# Patient Record
Sex: Male | Born: 1970 | Race: Black or African American | Hispanic: No | Marital: Single | Smoking: Never smoker
Health system: Southern US, Community
[De-identification: ages and names within clinical notes are randomized; demographics above are authoritative.]

## PROBLEM LIST (undated history)

## (undated) DIAGNOSIS — E079 Disorder of thyroid, unspecified: Secondary | ICD-10-CM

## (undated) DIAGNOSIS — E119 Type 2 diabetes mellitus without complications: Secondary | ICD-10-CM

## (undated) HISTORY — PX: NM THYROID STIM SUPRESS: HXRAD602

## (undated) HISTORY — PX: HEMATOMA EVACUATION: SHX5118

## (undated) HISTORY — DX: Disorder of thyroid, unspecified: E07.9

## (undated) HISTORY — DX: Type 2 diabetes mellitus without complications: E11.9

---

## 1998-02-16 ENCOUNTER — Emergency Department (HOSPITAL_COMMUNITY): Admission: EM | Admit: 1998-02-16 | Discharge: 1998-02-16 | Payer: Self-pay | Admitting: Emergency Medicine

## 1998-04-29 ENCOUNTER — Emergency Department (HOSPITAL_COMMUNITY): Admission: EM | Admit: 1998-04-29 | Discharge: 1998-04-29 | Payer: Self-pay | Admitting: Emergency Medicine

## 1998-06-01 ENCOUNTER — Emergency Department (HOSPITAL_COMMUNITY): Admission: EM | Admit: 1998-06-01 | Discharge: 1998-06-01 | Payer: Self-pay | Admitting: Emergency Medicine

## 1998-09-18 ENCOUNTER — Emergency Department (HOSPITAL_COMMUNITY): Admission: EM | Admit: 1998-09-18 | Discharge: 1998-09-18 | Payer: Self-pay | Admitting: Emergency Medicine

## 1998-09-21 ENCOUNTER — Encounter: Admission: RE | Admit: 1998-09-21 | Discharge: 1998-09-21 | Payer: Self-pay | Admitting: Sports Medicine

## 1998-11-27 ENCOUNTER — Emergency Department (HOSPITAL_COMMUNITY): Admission: EM | Admit: 1998-11-27 | Discharge: 1998-11-27 | Payer: Self-pay | Admitting: Internal Medicine

## 1999-06-01 ENCOUNTER — Observation Stay (HOSPITAL_COMMUNITY): Admission: EM | Admit: 1999-06-01 | Discharge: 1999-06-02 | Payer: Self-pay | Admitting: Emergency Medicine

## 1999-07-30 ENCOUNTER — Inpatient Hospital Stay (HOSPITAL_COMMUNITY): Admission: EM | Admit: 1999-07-30 | Discharge: 1999-08-03 | Payer: Self-pay | Admitting: Emergency Medicine

## 1999-07-30 ENCOUNTER — Encounter: Payer: Self-pay | Admitting: Family Medicine

## 1999-07-31 ENCOUNTER — Encounter: Payer: Self-pay | Admitting: *Deleted

## 2000-03-24 ENCOUNTER — Emergency Department (HOSPITAL_COMMUNITY): Admission: EM | Admit: 2000-03-24 | Discharge: 2000-03-24 | Payer: Self-pay | Admitting: Emergency Medicine

## 2000-03-27 ENCOUNTER — Emergency Department (HOSPITAL_COMMUNITY): Admission: EM | Admit: 2000-03-27 | Discharge: 2000-03-27 | Payer: Self-pay | Admitting: Emergency Medicine

## 2000-04-01 ENCOUNTER — Encounter: Payer: Self-pay | Admitting: General Surgery

## 2000-04-01 ENCOUNTER — Encounter: Admission: RE | Admit: 2000-04-01 | Discharge: 2000-04-01 | Payer: Self-pay | Admitting: Family Medicine

## 2000-04-01 ENCOUNTER — Ambulatory Visit (HOSPITAL_COMMUNITY): Admission: RE | Admit: 2000-04-01 | Discharge: 2000-04-01 | Payer: Self-pay | Admitting: Sports Medicine

## 2000-04-01 ENCOUNTER — Ambulatory Visit (HOSPITAL_COMMUNITY): Admission: AD | Admit: 2000-04-01 | Discharge: 2000-04-01 | Payer: Self-pay | Admitting: General Surgery

## 2000-04-04 ENCOUNTER — Emergency Department (HOSPITAL_COMMUNITY): Admission: EM | Admit: 2000-04-04 | Discharge: 2000-04-04 | Payer: Self-pay | Admitting: Emergency Medicine

## 2000-11-07 ENCOUNTER — Emergency Department (HOSPITAL_COMMUNITY): Admission: EM | Admit: 2000-11-07 | Discharge: 2000-11-07 | Payer: Self-pay | Admitting: Emergency Medicine

## 2001-04-21 ENCOUNTER — Emergency Department (HOSPITAL_COMMUNITY): Admission: EM | Admit: 2001-04-21 | Discharge: 2001-04-21 | Payer: Self-pay | Admitting: Internal Medicine

## 2001-05-22 ENCOUNTER — Emergency Department (HOSPITAL_COMMUNITY): Admission: EM | Admit: 2001-05-22 | Discharge: 2001-05-22 | Payer: Self-pay | Admitting: Emergency Medicine

## 2001-05-23 ENCOUNTER — Inpatient Hospital Stay (HOSPITAL_COMMUNITY): Admission: EM | Admit: 2001-05-23 | Discharge: 2001-05-28 | Payer: Self-pay

## 2001-05-24 ENCOUNTER — Encounter: Payer: Self-pay | Admitting: Family Medicine

## 2001-05-28 ENCOUNTER — Encounter: Payer: Self-pay | Admitting: Family Medicine

## 2001-07-03 ENCOUNTER — Emergency Department (HOSPITAL_COMMUNITY): Admission: EM | Admit: 2001-07-03 | Discharge: 2001-07-03 | Payer: Self-pay | Admitting: Emergency Medicine

## 2001-08-24 ENCOUNTER — Emergency Department (HOSPITAL_COMMUNITY): Admission: EM | Admit: 2001-08-24 | Discharge: 2001-08-24 | Payer: Self-pay | Admitting: Emergency Medicine

## 2001-09-15 ENCOUNTER — Emergency Department (HOSPITAL_COMMUNITY): Admission: EM | Admit: 2001-09-15 | Discharge: 2001-09-15 | Payer: Self-pay | Admitting: Emergency Medicine

## 2001-11-04 ENCOUNTER — Emergency Department (HOSPITAL_COMMUNITY): Admission: EM | Admit: 2001-11-04 | Discharge: 2001-11-04 | Payer: Self-pay | Admitting: Emergency Medicine

## 2001-12-14 ENCOUNTER — Emergency Department (HOSPITAL_COMMUNITY): Admission: EM | Admit: 2001-12-14 | Discharge: 2001-12-14 | Payer: Self-pay | Admitting: Emergency Medicine

## 2001-12-20 ENCOUNTER — Emergency Department (HOSPITAL_COMMUNITY): Admission: EM | Admit: 2001-12-20 | Discharge: 2001-12-20 | Payer: Self-pay

## 2002-02-07 ENCOUNTER — Emergency Department (HOSPITAL_COMMUNITY): Admission: EM | Admit: 2002-02-07 | Discharge: 2002-02-07 | Payer: Self-pay | Admitting: Emergency Medicine

## 2002-02-08 ENCOUNTER — Emergency Department (HOSPITAL_COMMUNITY): Admission: EM | Admit: 2002-02-08 | Discharge: 2002-02-08 | Payer: Self-pay | Admitting: Emergency Medicine

## 2002-02-18 ENCOUNTER — Emergency Department (HOSPITAL_COMMUNITY): Admission: EM | Admit: 2002-02-18 | Discharge: 2002-02-18 | Payer: Self-pay

## 2002-07-24 ENCOUNTER — Emergency Department (HOSPITAL_COMMUNITY): Admission: EM | Admit: 2002-07-24 | Discharge: 2002-07-24 | Payer: Self-pay | Admitting: Emergency Medicine

## 2002-09-06 ENCOUNTER — Emergency Department (HOSPITAL_COMMUNITY): Admission: EM | Admit: 2002-09-06 | Discharge: 2002-09-06 | Payer: Self-pay | Admitting: Emergency Medicine

## 2003-02-28 ENCOUNTER — Emergency Department (HOSPITAL_COMMUNITY): Admission: EM | Admit: 2003-02-28 | Discharge: 2003-02-28 | Payer: Self-pay | Admitting: Emergency Medicine

## 2003-02-28 ENCOUNTER — Encounter: Payer: Self-pay | Admitting: Emergency Medicine

## 2003-06-22 ENCOUNTER — Emergency Department (HOSPITAL_COMMUNITY): Admission: EM | Admit: 2003-06-22 | Discharge: 2003-06-22 | Payer: Self-pay

## 2003-11-23 ENCOUNTER — Emergency Department (HOSPITAL_COMMUNITY): Admission: EM | Admit: 2003-11-23 | Discharge: 2003-11-23 | Payer: Self-pay | Admitting: Emergency Medicine

## 2003-12-20 ENCOUNTER — Emergency Department (HOSPITAL_COMMUNITY): Admission: EM | Admit: 2003-12-20 | Discharge: 2003-12-20 | Payer: Self-pay | Admitting: Emergency Medicine

## 2004-05-11 ENCOUNTER — Emergency Department (HOSPITAL_COMMUNITY): Admission: EM | Admit: 2004-05-11 | Discharge: 2004-05-12 | Payer: Self-pay | Admitting: Emergency Medicine

## 2005-07-31 ENCOUNTER — Inpatient Hospital Stay (HOSPITAL_COMMUNITY): Admission: EM | Admit: 2005-07-31 | Discharge: 2005-08-03 | Payer: Self-pay | Admitting: Emergency Medicine

## 2005-10-30 ENCOUNTER — Ambulatory Visit: Payer: Self-pay | Admitting: Family Medicine

## 2005-10-30 ENCOUNTER — Inpatient Hospital Stay (HOSPITAL_COMMUNITY): Admission: EM | Admit: 2005-10-30 | Discharge: 2005-11-01 | Payer: Self-pay | Admitting: Emergency Medicine

## 2005-11-24 ENCOUNTER — Inpatient Hospital Stay (HOSPITAL_COMMUNITY): Admission: EM | Admit: 2005-11-24 | Discharge: 2005-11-27 | Payer: Self-pay | Admitting: Emergency Medicine

## 2005-11-24 ENCOUNTER — Ambulatory Visit: Payer: Self-pay | Admitting: Family Medicine

## 2006-01-07 ENCOUNTER — Emergency Department (HOSPITAL_COMMUNITY): Admission: EM | Admit: 2006-01-07 | Discharge: 2006-01-07 | Payer: Self-pay | Admitting: Emergency Medicine

## 2006-03-18 ENCOUNTER — Emergency Department (HOSPITAL_COMMUNITY): Admission: EM | Admit: 2006-03-18 | Discharge: 2006-03-18 | Payer: Self-pay | Admitting: Emergency Medicine

## 2006-05-22 ENCOUNTER — Emergency Department (HOSPITAL_COMMUNITY): Admission: EM | Admit: 2006-05-22 | Discharge: 2006-05-22 | Payer: Self-pay | Admitting: Emergency Medicine

## 2006-05-29 ENCOUNTER — Inpatient Hospital Stay (HOSPITAL_COMMUNITY): Admission: EM | Admit: 2006-05-29 | Discharge: 2006-06-01 | Payer: Self-pay | Admitting: Emergency Medicine

## 2006-07-10 ENCOUNTER — Observation Stay (HOSPITAL_COMMUNITY): Admission: EM | Admit: 2006-07-10 | Discharge: 2006-07-11 | Payer: Self-pay | Admitting: Emergency Medicine

## 2007-08-30 IMAGING — US US ABDOMEN COMPLETE
1 series · 14 of 25 positions shown · non-contrast
Comparison: None.

CLINICAL DATA: Vomiting.
 ABDOMEN ULTRASOUND:
TECHNIQUE: Complete abdominal ultrasound examination was performed including evaluation of the liver, gallbladder, bile ducts, pancreas, kidneys, spleen, IVC, and abdominal aorta.

[Series 1: unknown · 0.34mm/px · 14 of 52 slices shown]
[im 1/52]
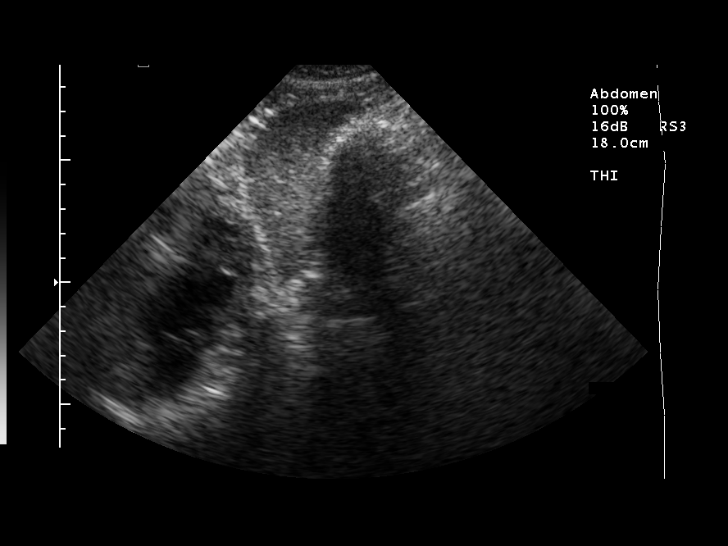
[im 5/52]
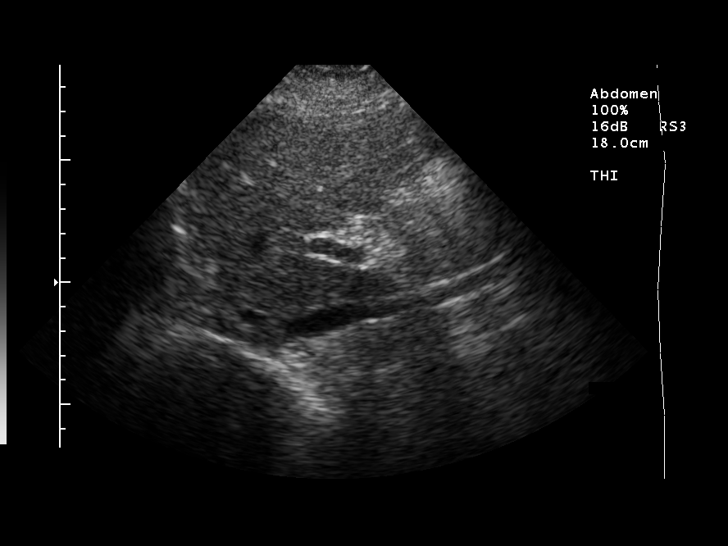
[im 9/52]
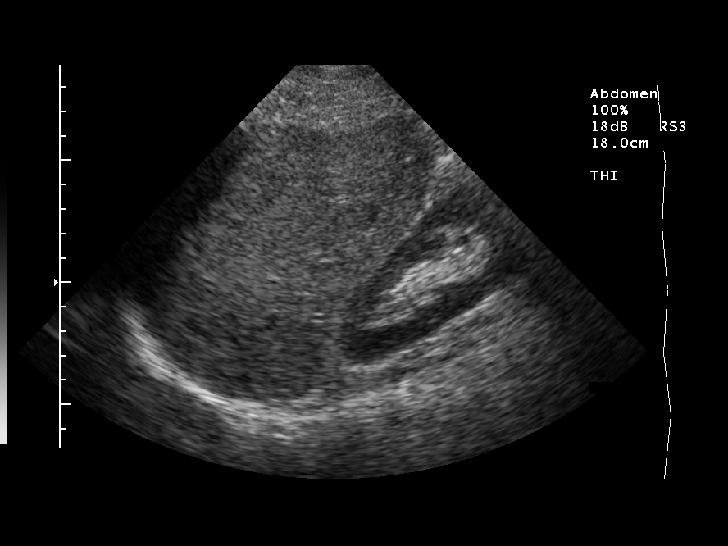
[im 13/52]
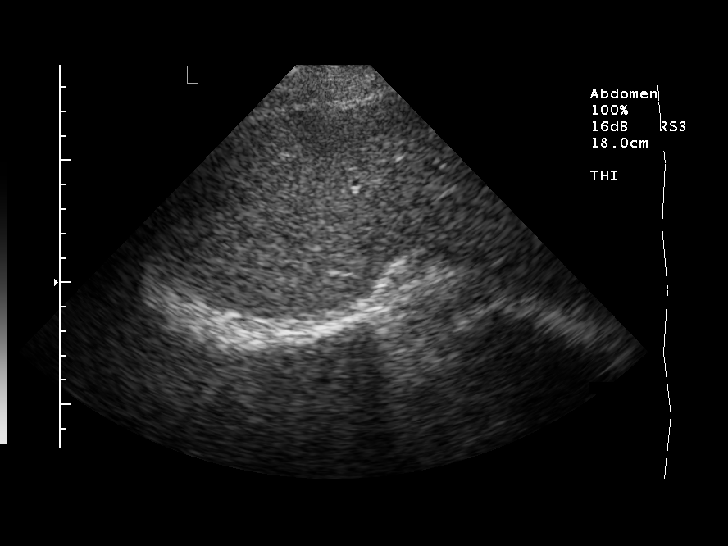
[im 18/52]
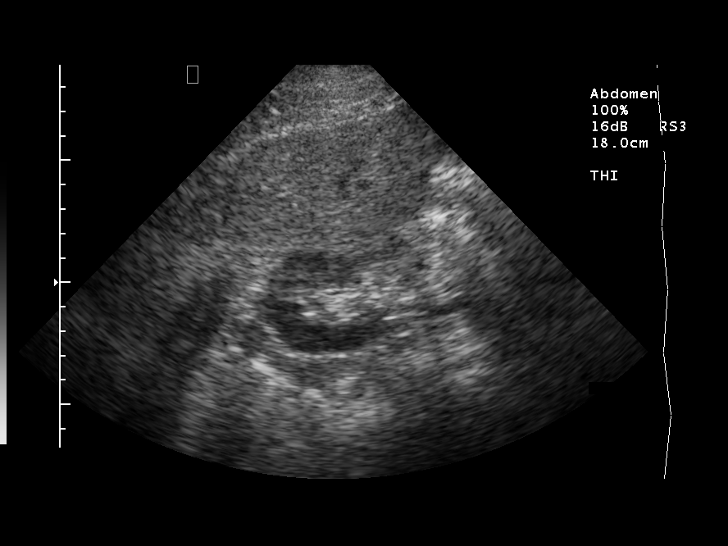
[im 20/52]
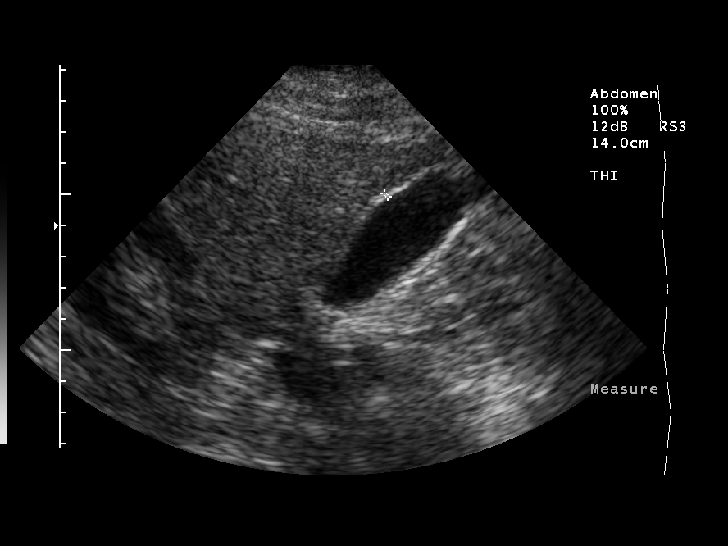
[im 24/52]
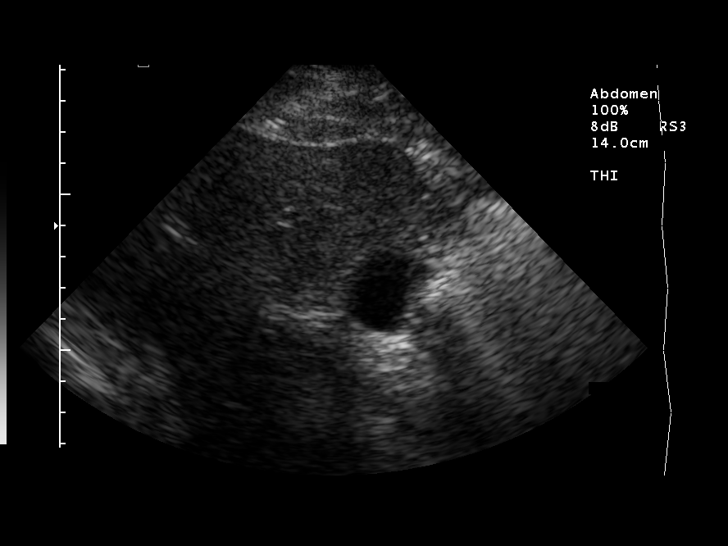
[im 28/52]
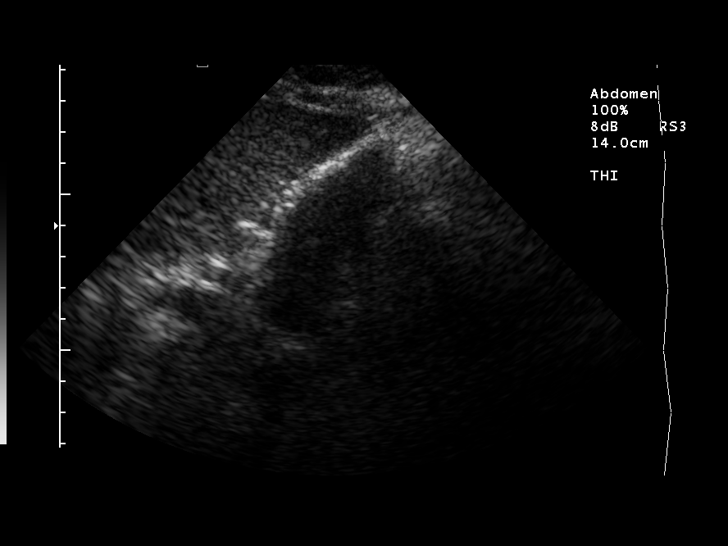
[im 32/52]
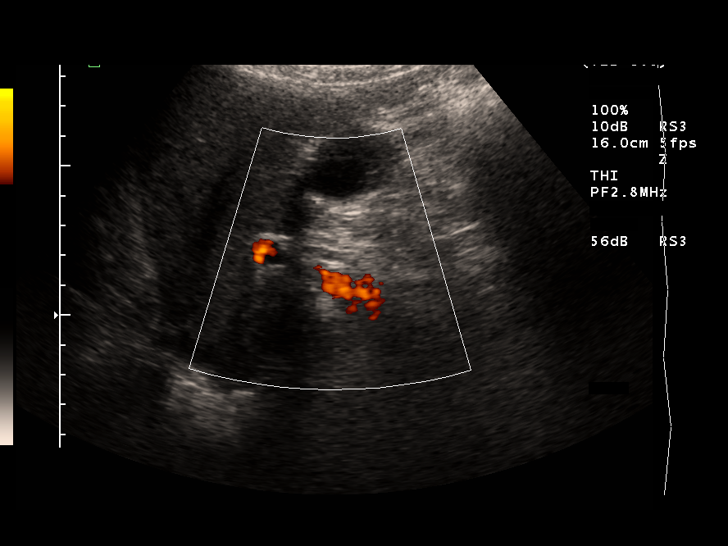
[im 35/52]
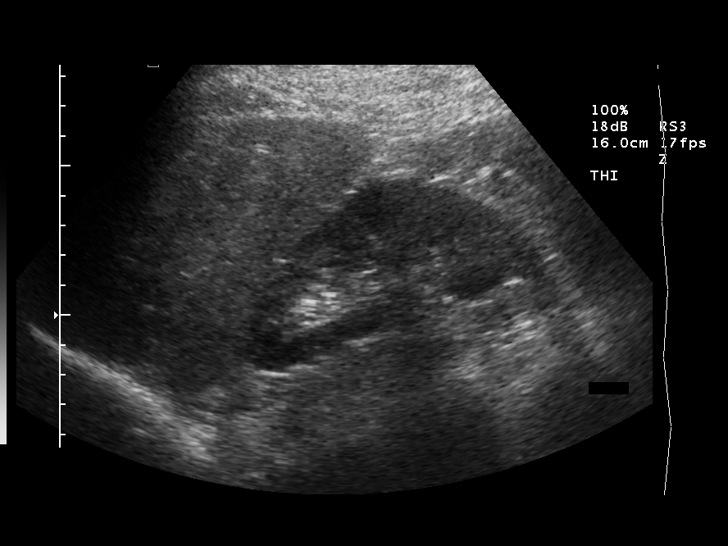
[im 39/52]
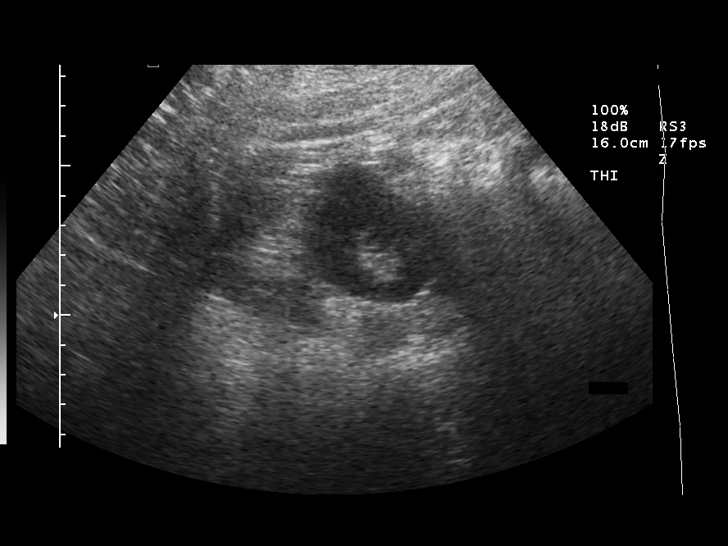
[im 43/52]
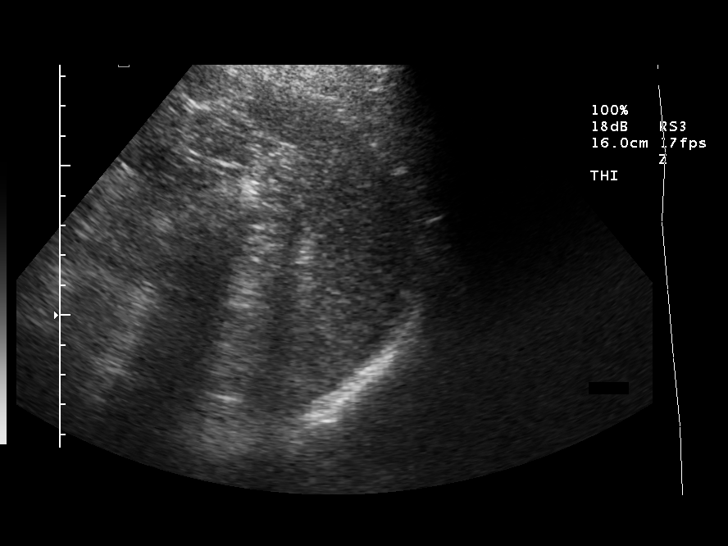
[im 47/52]
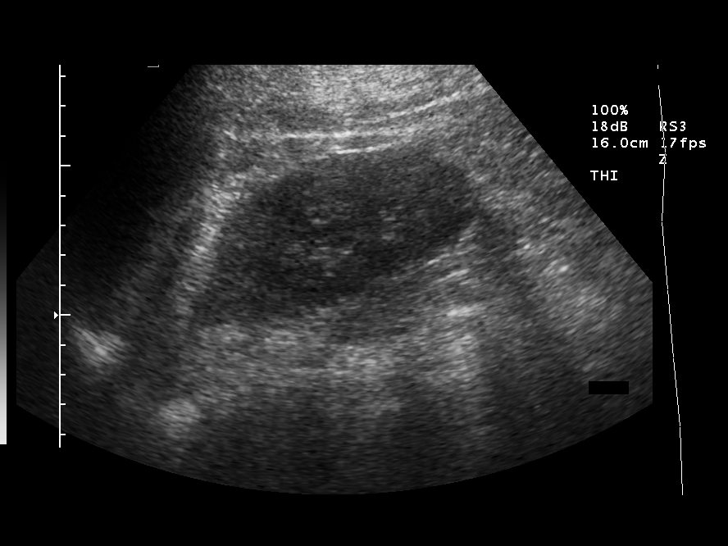
[im 52/52]
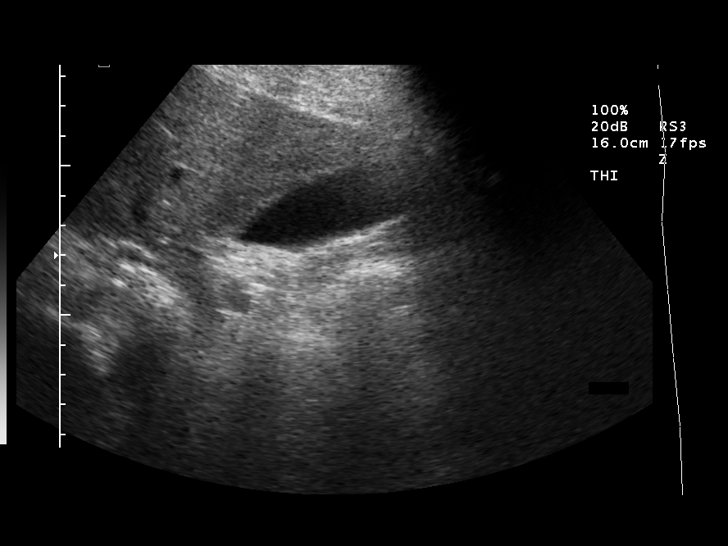

[14 of 25 positions shown; findings below may reference images not displayed]

FINDINGS: The gallbladder is normal.  There are no stones, sludge or wall thickening.  The common bile duct is normal in caliber.  Liver is negative. 
 The right kidney is negative.  The left kidney is negative.  Spleen is negative.
IMPRESSION: 1.   No acute findings. 
 2.  No evidence for gallbladder disease.

## 2007-11-03 IMAGING — CR DG CHEST 2V
2 series · 2 of 2 positions shown · non-contrast
Comparison: 11/24/2005

CLINICAL DATA: Diabetes. Chest pain.

[view not recorded (1 of 2)]
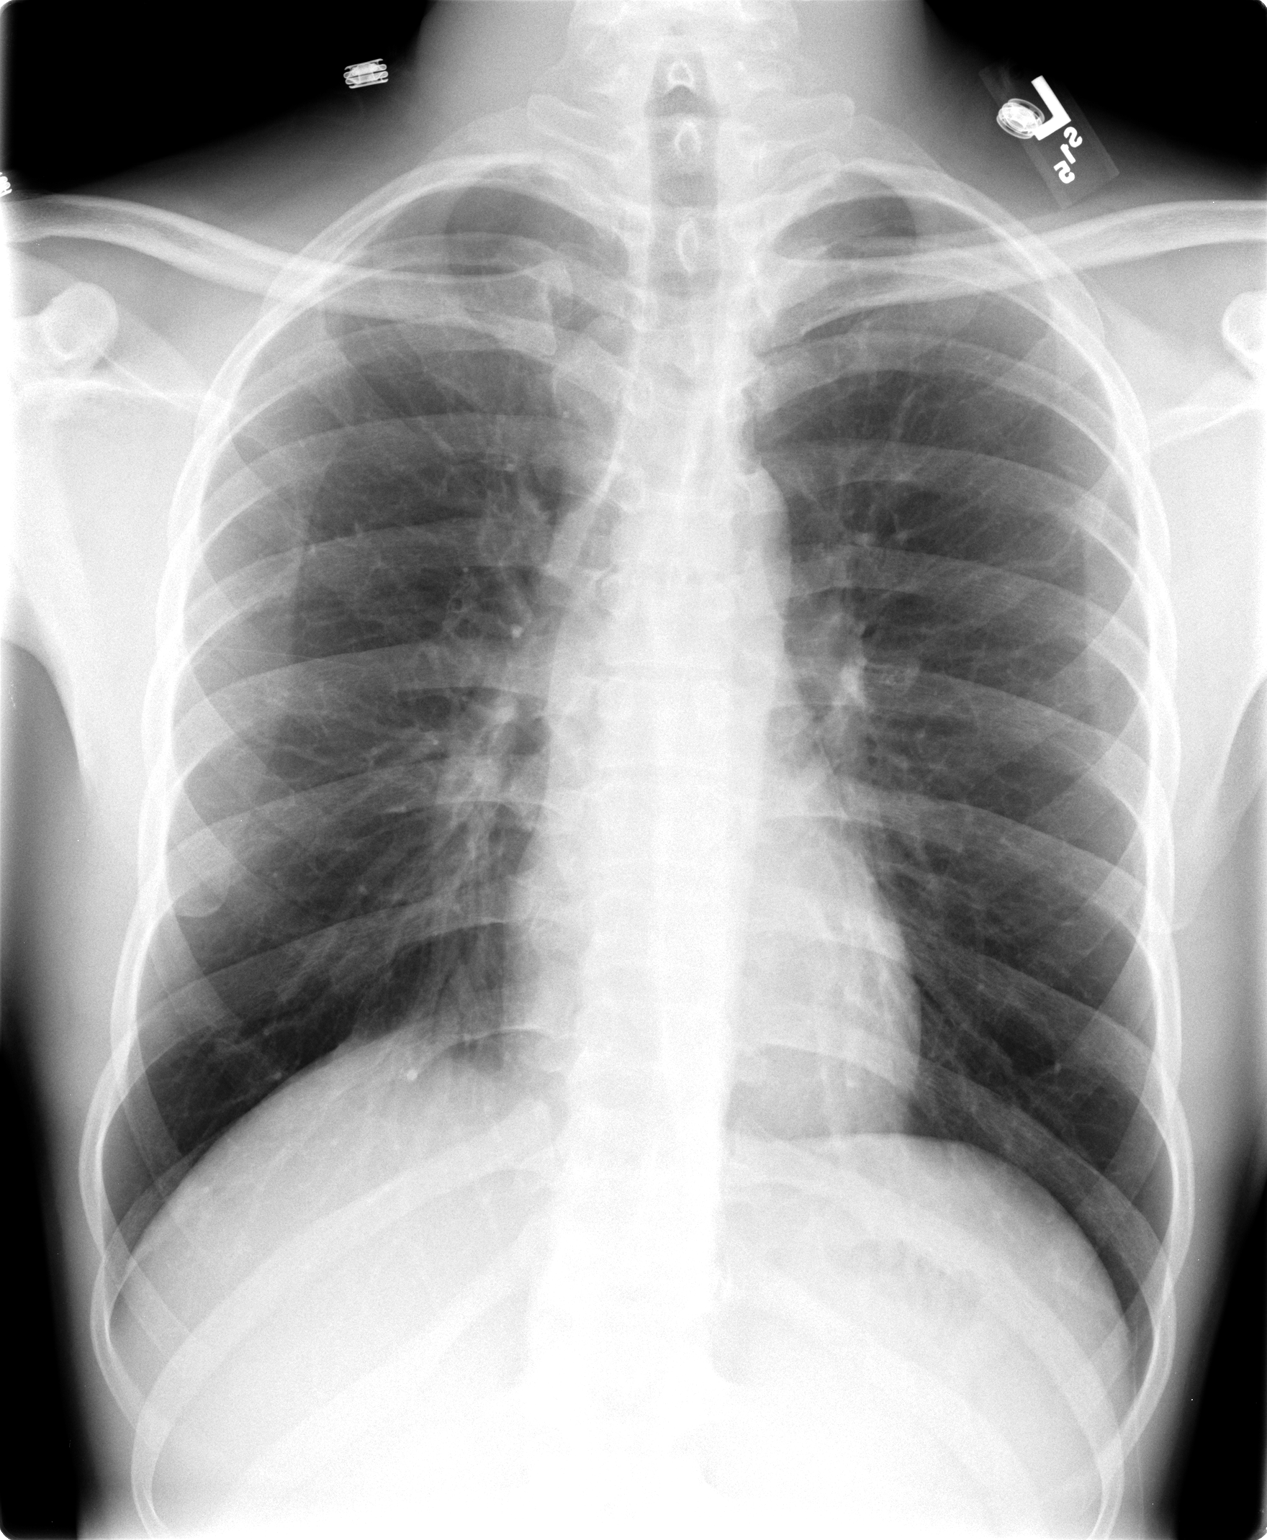

[view not recorded (2 of 2)]
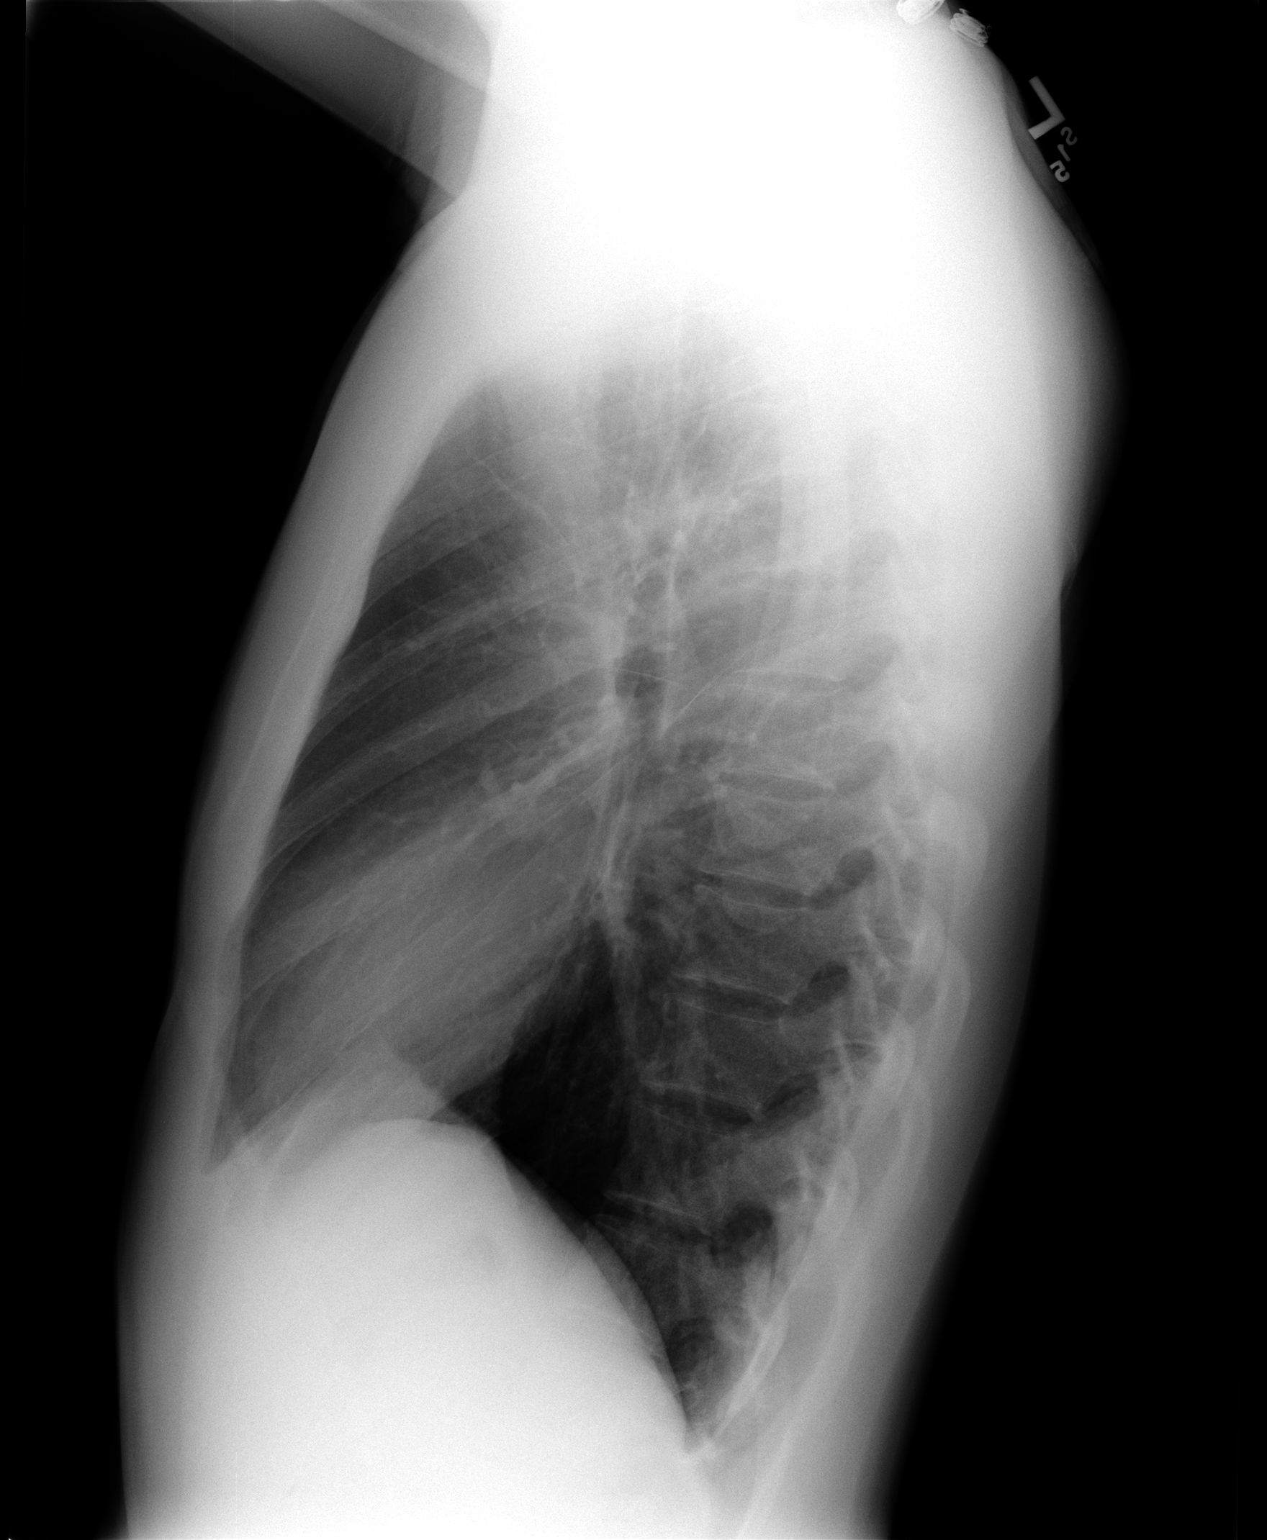

[2 of 2 positions shown; findings below may reference images not displayed]

CHEST - 2 VIEW:

The lungs are clear.  The cardiopericardial silhouette is within normal limits
for size.  Visualized bony structures of the thorax are intact.
IMPRESSION: No acute cardiopulmonary process

## 2008-05-16 ENCOUNTER — Emergency Department (HOSPITAL_COMMUNITY): Admission: EM | Admit: 2008-05-16 | Discharge: 2008-05-16 | Payer: Self-pay | Admitting: Emergency Medicine

## 2009-01-12 ENCOUNTER — Emergency Department (HOSPITAL_COMMUNITY): Admission: EM | Admit: 2009-01-12 | Discharge: 2009-01-12 | Payer: Self-pay | Admitting: Emergency Medicine

## 2011-01-09 LAB — GLUCOSE, CAPILLARY: Glucose-Capillary: 252 mg/dL — ABNORMAL HIGH (ref 70–99)

## 2011-02-15 NOTE — H&P (Signed)
NAMEMAHIR, PRABHAKAR NO.:  [id number]   MEDICAL RECORD NO.:  [id number]          PATIENT TYPE:  INP   LOCATION:  2918                         FACILITY:  [facility]   PHYSICIAN:  Melina Fiddler, MD DATE OF BIRTH:  1971-04-08   DATE OF ADMISSION:  11/24/2005  DATE OF DISCHARGE:                                HISTORY & PHYSICAL   CHIEF COMPLAINT:  Abdominal pain and vomiting for two days.   PRIMARY CARE PHYSICIAN:  Unassigned.   HISTORY OF PRESENT ILLNESS:  Mr. Bogie is a type 1 diabetic with a  history of DKA and multiple admissions who comes in complaining of nausea,  vomiting, and abdominal pain for the last two days.  This pain is similar to  the last abdominal pain he had on his last admission.  Last admission the  abdominal pain resolved with resolution of his diabetic ketoacidosis.  Patient says that eating initially helped his abdominal pain for a few hours  but then the pain would get worse again.  However, eating has not helped  these last times that he has tried.  Patient took Rolaids without relief.  He has been drinking diet ginger ale but he does not think he has kept up  with his fluids.  Patient's blood sugars have been up and down and he has  been taking all of his medications including his insulin except for the  amoxicillin he was discharged with.  The amoxicillin was for a sinusitis  diagnosed at last admission almost a month ago.  Patient did have one  episode of epistaxis last week which resolved.  No hematemesis.  No  hematochezia.  No ear or sinus pain.  Patient described his abdominal pain  as mid epigastric that was cramping, sharp and it started after the  vomiting.  No diarrhea.  No fever.  Positive chills.   Patient also complains of about a 30 pound weight loss in the last few  months.  He was 196 pounds on December 2006 and he was 162 last admission.  He says this is due to a decreased appetite.   PAST MEDICAL HISTORY:  1.   Diabetes type 1 with multiple admissions.  2.  Anemia of chronic disease.  3.  History of pancreatitis in fall 2006.  4.  Chronic headaches.  5.  Cardiolite 2002 was within normal limits, EF 55%.   MEDICATIONS:  1.  Novolin 70/30 30 units q.a.m. and 20 units with dinner.  2.  Multivitamin.  3.  Sliding scale insulin which patient uses Regular Novolin.   ALLERGIES:  No known drug allergies.   SOCIAL HISTORY:  Patient works at [employer].  Denies alcohol, tobacco, and  drugs.  Patient is single.   FAMILY HISTORY:  Mother is alive at 74 with type 2 diabetes.  Grandma died  of diabetic complications at age 22.  A cousin also has type 1 diabetes.   PHYSICAL EXAMINATION:  VITAL SIGNS:  Temperature 99.8, pulse 140 which came  down to 132, respirations 22 which came down to 14, blood pressure 132/62,  92% on room air.  HEENT:  Extraocular muscles intact.  Pupils are equal, round, and reactive  to light.  Oropharynx clear.  TMs with effusion left greater than right.  Left with purulence behind tympanic membrane.  NECK:  Soft, supple with no lymphadenopathy.  CARDIOVASCULAR:  Tachycardic, but regular.  No murmurs.  No JVD.  LUNGS:  Clear to auscultation bilaterally without wheezes, rales, or  rhonchi.  ABDOMEN:  Tender to palpation in the mid epigastrium with some guarding, but  no rebound.  EXTREMITIES:  No clubbing, cyanosis, or edema.  Patient had good sensation  in pulses in bilateral lower extremities with 5/5 upper extremity and lower  extremity strength.  NEUROLOGIC:  CN II-XII intact.   LABORATORIES:  White blood cells 12.2, H&H 12.5/37.6, platelets 250.  MCV  was 80.3, ANC 9.6.  Sodium 139, potassium 4.6, chloride 103, bicarbonate 15,  BUN 27, creatinine 1.6, glucose 474.  Patient's anion gap was 21.  AST 51,  ALT 72, alkaline phosphatase 87, bilirubin 1.6, protein 7.2, calcium 10.2,  lipase 20.  A UA showed specific gravity of 1.027, glucose greater than  1000, ketones  greater than 80, protein negative.  Nitrites and leukocyte  esterase negative.  Chest x-ray showed no acute abnormalities.  Abdominal  ultrasound from February 1 showed no acute abnormalities.   ASSESSMENT/PLAN:  1.  This is a 40 year old male with diabetic ketoacidosis with a gap of 21.      We will start Glucommander and give patient intravenous fluid boluses.      Patient is dehydrated per the UA.  Normal saline x2 L and then normal      saline plus 20 mEq KCl at 250 mL/hour.  When CBGs are less than 250 will      switch to D5 normal saline.  Check BMET q.2h. x4 and then will      reevaluate.  We will need to carefully follow patient's potassium      bicarbonate.  We will check an ABG to get a pH.  Diabetic ketoacidosis      may be secondary to sinusitis which is untreated, myocardial infarction      unlikely at this stage, other infections.  Will check urine culture,      blood culture.  Patient's HBA1c on February 1 was 8.6.  2.  Sinusitis/otitis media.  Patient did not take his amoxicillin.  Giving      him amoxicillin currently will likely make him nauseous.  Will treat him      with Avelox intravenous until taking p.o.  3.  Elevated LFTs.  This is minimal elevation.  A hepatitis panel was      negative less than one month ago.  Patient denies alcohol.  Abdominal      ultrasound was negative less than one month ago.  Will get KUB but      elevated LFTs may be secondary to dehydration/hypoperfusion.  4.  Tachycardia.  This is likely secondary to dehydration.  Patient      experienced the same symptoms last admission when he was also      dehydrated.  We will replete his fluids and reevaluate.  5.  Anemia.  Patient likely is currently hemoconcentrated.  We will      reevaluate his hemoglobin after the intravenous fluids.  Last admission      patient's B12 and iron were within normal limits.  I will check a      ferritin. 6.  Weight loss.  This is likely secondary to  decreased  appetite, nausea,      and vomiting.  We will check daily weights.      Rolm Gala, M.D.    ______________________________  Melina Fiddler, MD    HG/MEDQ  D:  11/24/2005  T:  11/25/2005  Job:  [id number]

## 2011-02-15 NOTE — H&P (Signed)
[place]  Patient:    [name], [name] Visit Number: [id number] MRN: [id number]          Service Type: EMS Location: [place] Attending Physician:  Doug Sou Dictated by:   Terrance Mass, M.D. Adm. Date:  05/22/2001 Disc. Date: 05/22/2001                           History and Physical  CHIEF COMPLAINT:  Diabetic ketoacidosis.  HISTORY OF PRESENT ILLNESS:  Mr. Molner is a 40 year old man with a history of diabetes mellitus type 1, who presents with hypoglycemia.  He is not orally responsive by his friends at home.  He was seen in the ED the day prior and was noted to have a glucose of 275.  He was sent home.  His blood sugar had been running in the 200 to 300s for about 3 weeks.  He reports that he had been taking his insulin during that time, but was noticing polyuria, polydipsia.  Has noticed some abdominal pain over the last 3 days.  Did recently have a URI with some sneezing, rhinorrhea.  Denied any fever, productive cough, dysuria.  Scant diarrhea.  He has had ______ images over the last few days.  After he left the ED, the day prior to admission, he did not take his evening or morning insulin.  He was found by his friends, and brought in by EMS.  PAST MEDICAL HISTORY: 1. Type 1 diabetes mellitus. 2. Chronic headaches. 3. History of medical noncompliance, hospitalized multiple times and multiple    ED visits.  MEDICATIONS:  Insulin 70/30 40 units q.a.m. and 20 units q.p.m.  SOCIAL HISTORY:  Lives by himself.  Does not work.  Only has nephew in town. Has a high school degree, has some college education.  ALLERGIES:  No known drug allergies.  PAST SURGICAL HISTORY:  Denies any procedures.  FAMILY HISTORY:  Does not provide in detail.  Denies family history of diabetes, hypertension, stroke, and cancer.  REVIEW OF SYSTEMS:  The patient does not respond consistently to questions.  PHYSICAL EXAMINATION:  VITAL SIGNS:   Heart rate 111, blood pressure 111/69 to 197/62, respiratory rate 19, 100% on 3 L.  GENERAL:  Sedated, in no acute distress.  Able to move to bedside commode with assistance.  Responds intermittently to questions and will follow commands.  HEENT:  Normocephalic, atraumatic.  Slight erythema to the left lid.  Pupils are equal, round and reactive to light and accommodation.  Extraocular movements intact.  Nonicteric sclerae.  Normal TMs.  Oropharynx without erythema or exudate.  Teeth within normal limits.  NECK:  Supple, no masses.  CARDIOVASCULAR:  Tachy, no murmurs, no rubs.  LUNGS:  Poor respiratory effort, otherwise clear, no crackles, wheezes, or rhonchi noted.  MUSCULOSKELETAL:  Moves all fours with poor effort.  GASTROINTESTINAL:  Nontender, nondistended, no masses.  INTEGUMENT:  Scar on left cheek.  NEUROLOGIC:  Cranial nerves II-XII roughly intact.  RECTAL:  Deferred.  LABORATORY DATA:  ABGs:  pH 7.101, bicarbonate 19.2, O2 158.  Initial ISTAT: Sodium 131, potassium 8.0, chloride 103, BUN 53, creatinine 3.1, glucose greater than 700.  Redo labs:  Potassium 7.9, glucose 1114, bicarbonate 7. Serum ketones small.  EKG with peak Ts.  White count 24.7, hemoglobin and hematocrit 4.6 and 34.9, platelets 174.  Chest x-ray no active disease.  Blood cultures pending.  ASSESSMENT AND PLAN: 1. This is a 40 year old male  with a long history of noncompliance who    presents with DKA.  He has received 2.5 L in ED, is currently on 1/2 normal    saline at 250 cc, insulin bolus of 10 units was given in ED.  Now is on    Glucomander.  We will check next BMP, and change to steady IV drip, and    adjust accordingly based on CMGs/BMPs q.1h.  We will plan on adding D5 when    CBGs are less than 250, and decreasing insulin drip.  We will change to    subcutaneous insulin once anion gap is closed.  No infectious source    currently precipitating the factor for DKA.  However, does have an  elevated    white count of 24.7.  Chest x-ray within normal limits.  UA and urine    culture pending.  Blood culture pending.  No evidence of cellulitis.  The    patient does admit to noncompliance over the last 24 hours, and does have a    long history of noncompliance.  Very likely this is the cause for his DKA. 2. Noncompliance.  Social work consult and we will discuss with primary M.D. 3. Renal insufficiency.  Questionable acute and chronic.  We will watch for    resolution with IV fluids. Dictated by:   Terrance Mass, M.D. Attending Physician:  Doug Sou DD:  05/24/01 TD:  05/25/01 Job: [id number]
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
## 2011-02-15 NOTE — H&P (Signed)
[name], [name] NO.:  [id number]   MEDICAL RECORD NO.:  [id number]          PATIENT TYPE:  INP   LOCATION:  1833                         FACILITY:  [facility]   PHYSICIAN:  Andres Shad. Rudean Curt, MD     DATE OF BIRTH:  04/27/1971   DATE OF ADMISSION:  07/10/2006  DATE OF DISCHARGE:                              HISTORY & PHYSICAL   CHIEF COMPLAINT:  Nausea and vomiting.   HISTORY OF PRESENT ILLNESS:  [name] is a 40 year old man with a  history of type 1 diabetes and multiple episodes of diabetic  ketoacidosis who presented to the emergency room today with nausea and  vomiting.  He had felt well until yesterday evening when he had some  stomach discomfort, and then, when he awoke this morning, he began  having intractable vomiting, at which time he was brought to the  emergency department.  The patient has a history of multiple admissions  for diabetic ketoacidosis.  He was originally diagnosed with type 1  diabetes at the age of 82.  He was most recently an inpatient at [facility] from August 30th until June 01, 2006 with diabetic ketoacidosis  associated with a toe that had become infected following an injury.  He  was also an inpatient here in February.   MEDICATIONS:  Insulin 70/30, 23 units in the morning, 16 units in the  evening, although the patient states that he was about to be changed to  Lantus on 20 units nightly with NovoLog coverage for meals.  However, he  had not started this regimen yet at the time of admission.   PAST MEDICAL HISTORY:  As above.  In addition, the patient had acute  pancreatitis in 2006.   ALLERGIES:  NKDA.   FAMILY HISTORY:  Noncontributory.   SOCIAL HISTORY:  The patient denies any alcohol, tobacco or drug use.  He lives with a roommate.   REVIEW OF SYSTEMS:  Of most significance, the patient acknowledges a  weight loss of approximately 50 pounds over the course of the last year  that has been unintentional.  He  denies any cough, fever, night sweats  or chills.  He does say that he had a negative HIV test in 2001, but has  not been tested since then and is interested in being tested again  today.   PHYSICAL EXAMINATION:  VITAL SIGNS:  Temperature 99, blood pressure  127/62, heart rate 112, respiratory rate 18, oxygen saturation 96% on  room air.  GENERAL:  The patient was awake, alert and oriented.  HEENT:  Mucous membranes are dry.  No thrush.  No adenopathy.  CHEST:  Clear to auscultation bilaterally.  CARDIAC:  Tachycardic and regular.  No murmurs.  ABDOMEN:  Normal bowel sounds.  Soft, nontender and nondistended.  No  organomegaly.  EXTREMITIES:  Well perfused.  No clubbing, cyanosis or edema.   LABORATORY STUDIES:  Liver function tests were normal.  Lipase was 19.  Sodium 136, potassium 4.6, chloride 104, BUN 27, glucose 498, creatinine  1.2.  Urinalysis positive for glucose and ketones.   ASSESSMENT/PLAN:  1. Diabetic  ketoacidosis.  The patient was begun on an insulin drip      and received an IV bolus in the emergency department.  We will      continue the insulin drip until his anion gap normalizes and      monitor electrolytes every 8 hours.  IV fluids will be continued      with normal saline at 150 mL/hr, and he will receive additional      boluses for tachycardia.  Glucose will be managed with an insulin      drip.  Once his anion gap normalizes and his glucose remains below      200, I will begin his home regimen of Lantus 20 units per day with      sliding scale coverage.  2. Weight loss.  Per the patient's request, I will order an HIV      serology.      Andres Shad. Rudean Curt, MD  Electronically Signed     PML/MEDQ  D:  07/10/2006  T:  07/10/2006  Job:  [id number]

## 2011-02-15 NOTE — Discharge Summary (Signed)
[place]. [facility]  Patient:    LATRON, RIBAS Visit Number: [id number] MRN: [id number]          Service Type: MED Location: [phone] Attending Physician:  McDiarmid, Leighton Roach. Dictated by:   Rodolph Bong, Admit Date:  05/23/2001 Discharge Date: 05/28/2001   CC:         Raynelle Jan   Discharge Summary  DISCHARGE DIAGNOSES: 1. Diabetes mellitus type 1. 2. Headache, unspecified. 3. History of medical noncompliance, hospitalized multiple times and multiple    emergency department visits.  DISCHARGE MEDICATIONS:  Insulin 70/30 40 units q.a.m., 20 units q.p.m.  LABORATORIES: 1. Chest x-ray. 2. CT of the head. 3. Exercise Cardiolite.  Sodium 137, potassium 4.6, chloride 104, bicarbonate 26, glucose 301, BUN 13, creatinine 1.1, calcium 9.1.  Hemoglobin A1C 10.0.  CONSULTS:  Cardiology-Dr. Corinda Gubler.  HISTORY OF PRESENT ILLNESS:  Mr. Lacko is a 40 year old man with history of diabetes mellitus type 1 who presented with hypoglycemia.  He was noticed to be unresponsive orally to his friends.  He had been seen in the emergency department the day prior and was known to have a glucose of 275 and sent home. Reported his blood sugar running 200-300 for about three weeks.  He began noticing polyuria, polydypsia but states that he had been taking his insulin during that time.  In addition, he had developed some abdominal pain in the past three days.  On the day prior to his admission he stated he did not take his evening or morning doses of insulin.  He was found by his friends and brought to the emergency department.  HOSPITAL COURSE:  #1 - DIABETIC KETOACIDOSIS:  Upon presentation to the emergency department an I-STAT was performed which revealed a sodium of 131, potassium 8.0, chloride 103, BUN 53, creatinine 3.1, glucose greater than 700.  Laboratories were redone which showed potassium 7.9, glucose 1014, bicarbonate 7, serum  ketones were small.  In addition, he had an EKG performed with peak T-waves.  He was also noted to have a white count of 24.7, hemoglobin and hematocrit of 14.6 and 34.9, platelets 174,000.  These resulted in a corrected serum sodium of 147.5 and an anti-N gap of 37.  After presentation he received 2.5 L in the emergency department and was placed on half normal saline at 250 cc/hour.  In addition, insulin bolus of 10 units was given and he was placed on the Glucommander.  A chest x-ray was performed which was within normal limits and showed no active disease.  While in the hospital Mr. Lapaglia was continued with aggressive dehydration.  D5 was added when his CBGs became less than 250 and he was maintained on decreasing insulin drip.  Mr. Kempker continued to improve throughout his hospitalization and on day #4 was switched back to his home medical and insulin regimen with improved blood glucose control ranging from 100-200.  In addition, potassium replacement was maintained throughout his hospital stay and he was discharged with a potassium of 4.6.  Blood cultures were pending at the time of discharge.  #2 - CHEST PAIN:  On August 25 Mr. Davalos began experiencing some chest pain in addition to nausea.  Initial cardiac enzymes revealed elevated levels CK at 498, CK-MB 8.4, troponin 0.14.  Repeat laboratories showed CK 707, CK-MB 6.5, troponin I 0.13.  At that point a cardiology consult was obtained.  They doubted that this pain was ______ cardiac process, but recommended followup cardiac enzymes  and an echocardiogram.  The enzymes continued to trend down with the next set revealing CK 587, MB 2.1, troponin I 0.09.  The echocardiogram revealed normal findings.  At that point due to the negative echocardiogram and decreasing trending cardiac enzymes, cardiology recommended an inpatient exercise Cardiolite study which was obtained on the day of discharge.  The Cardiolite was negative  for ischemia or fixed perfusion defects and revealed a left ventricular ejection fraction of 56%.  At this point cardiology recommended no further followup was needed.  #3 - RENAL:  At the time of hospitalization Mr. Madero had a BUN of 53, creatinine 3.1 probably secondary to presentation of DKA.  With aggressive rehydration his renal function normalized and he was discharged with a BUN 13, creatinine 1.1.  CONDITION ON DISCHARGE:  Stable and improved.  DISPOSITION:  Home.  FOLLOW-UP:  Dr. Carolyne Fiscal [facility] September 9 at 9:55 a.m.  Blood cultures are still pending at the time of discharge. Dictated by:   Rodolph Bong, Attending Physician:  McDiarmid, Leighton Roach DD:  05/29/01 TD:  05/29/01 Job: [id number]

## 2011-02-15 NOTE — H&P (Signed)
[place]. [facility]  Patient:    [name]                     MRN: [id number] Adm. Date:  [id number] Attending:  Tobin Chad Dictator:   Matthew Folks, M.D.                         History and Physical  CHIEF COMPLAINT:  Mental status changes.  HISTORY OF PRESENT ILLNESS:  [name] is a 40 year old male with known type 1 diabetes with a history of DKA and noncompliance, who is brought in by friends because "he was not acting right."  He has had increasing lethargy the last several days.  His friends are uncertain if he has been taking any of his medications. The patient is essentially noncommunicative at the time of admission.  Review of systems unavailable.  PAST MEDICAL HISTORY:  Diabetes mellitus, type 1.  Headaches.  MEDICATIONS:  Insulin 70/30 40 units q.a.m., 20 units q.p.m.  ALLERGIES:  No known drug allergies.  SOCIAL HISTORY:  Not obtainable.  FAMILY HISTORY:  Not obtainable.  OBJECTIVE:  VITAL SIGNS:  Temperature 98.6, pulse 105, respirations 24, blood pressure 103/40, 98% on room air.  GENERAL:  The patient was obtunded, would respond to physical stimuli.  HEENT:  NCAT.  PERRL.  TMs clear.  Mucous membranes were dry.  Poor dentition. OP clear.  NECK:  Supple.  No lymphadenopathy or thyromegaly.  CHEST:  CTAB.  Tachypneic with shallow respirations.  No accessory muscle use.  CARDIOVASCULAR:  Tachycardic, 2/6 systolic murmur at the base.  ABDOMEN:  Soft, normoactive bowel sounds, had mild tenderness to palpation throughout.  No guarding, rebound, masses, or hepatosplenomegaly.  EXTREMITIES:  Poor capillary refill and skin turgor.  Pulses were palpable and thready.  NEUROLOGICAL:  Unable to assess beyond the patients lethargic status with some response.  Moved all extremities.  LABORATORY DATA:  WBC 25.8, ANC 22.3, hemoglobin 11.4.  Sodium 127, potassium 7.4, chloride 96, bicarb 12, BUN 61, glucose  greater than 450, pH 7.132.  Urine drug  screen with tricycles was negative.  UA had greater than 1000 glucose, positive for ketones, otherwise unremarkable.  Blood alcohol level was less than 10.  ASSESSMENT AND PLAN:  A 40 year old male with diabetic ketoacidosis evidence by  hyperglycemia, acidosis, hyperkalemia, and dehydration with increased anion gap, as well as leukocytosis.  Uncertain of trigger, although patient has a history of noncompliance.  Unfortunately he is unable to respond to questions, difficult to exam.  Will admit to an intensive care unit bed, start aggressive intravenous fluid, rehydration, insulin drip, serial exams, watch for localizing symptoms of infection.  Check CBGs every hour and basic metabolic stat every two hours. DD:  07/31/99 TD:  07/31/99 Job: [id number]

## 2011-02-15 NOTE — H&P (Signed)
NAMECANE, DUBRAY NO.:  [id number]   MEDICAL RECORD NO.:  [id number]          PATIENT TYPE:  INP   LOCATION:  2902                         FACILITY:  [facility]   PHYSICIAN:  Pearlean Brownie, M.D.DATE OF BIRTH:  26-Jun-1971   DATE OF ADMISSION:  10/30/2005  DATE OF DISCHARGE:                                HISTORY & PHYSICAL   CHIEF COMPLAINT:  Abdominal pain, nausea, vomiting for four days.   HISTORY OF PRESENT ILLNESS:  Patient is a 40 year old male with a history of  type 1 diabetes and multiple hospital admissions for DKA who comes in with  nausea and vomiting and abdominal pain for four days.  Most of history was  obtained from ER physician as the patient had gotten Dilaudid by the time  medical team was down in the ER and patient was sleepy, but arousable and  was unable to give history.  Patient did nod when he was asked if he had  abdominal pain.   PAST MEDICAL HISTORY:  1.  Type 1 diabetes.  2.  Pancreatitis.  3.  Chronic headache.  4.  Cardiolite in 2002 by [facility] was negative, EF 55%.   MEDICATIONS:  (Per discharge in November)  1.  Humulin 70/30 10 in the a.m., 20 in the p.m.  2.  Sliding scale insulin.  3.  Protonix 40 daily.   ALLERGIES:  No known drug allergies per discharge summary November 2006.   SOCIAL HISTORY:  Per discharge summary November 2006 patient denies alcohol,  drugs, and tobacco.  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   FAMILY HISTORY:  Patient has a mother 68 who has diabetes.  Father's medical  history is unknown.   PHYSICAL EXAMINATION:  VITAL SIGNS:  Temperature 97.6 which went up to  101.1, pulse initially 158, came down to 88, went back up to 138,  respirations 20, blood pressure 115/72.  HEENT:  Patient had dry mucous membranes, dilated pupils which react to  light.  CARDIAC:  Tachycardic, but regular.  No murmurs.  LUNGS:  Clear to auscultation bilaterally.  ABDOMEN:  Soft, nontender  even with deep palpation.  No rebound or guarding.  Very pulsatile aorta.  Heart sounds loud throughout abdomen.  EXTREMITIES:  No clubbing, cyanosis, edema.   LABORATORIES:  White blood cells 11.1, H&H 13.1/39.7, platelets 314.  ANC  was 8.5, lactic acid 8.5, ketones small.  Bilirubin 1.7, direct bilirubin  0.3, indirect bilirubin 1.4.  Alkaline phosphatase 79, AST 42, ALT 49,  lipase 29.  UA 1.027, greater than 100 glucose, greater than 80 ketones,  otherwise negative.  pH 7.348, pCO2 20.3, pO2 12, bicarbonate 11.1.  Sodium  132, potassium 4.8, chloride 107, bicarbonate 11.1, BUN 26, creatinine 1.2,  glucose 488.  ABG 7.26/35/87/16.  Chest x-ray showed no acute.  EKG:  Sinus  tachycardia, questionable right bundle branch block.   PROBLEM:  1.  DKA.  Patient with a history of type 1 diabetes.  Will start      Glucommander with 2.5 L bolus and then change IV fluids to normal saline  at 250 mL/hour with 20 of KCl.  When CBG less than 250 will change to D5      normal saline still with 20 of KCl.  Will get BMETs q.2h. x4, oxygen at      2 L nasal cannula.  Strict Is and Os.  n.p.o.  Plan to restart 70/30      when stable.  Follow bicarbonate and potassium closely.  2.  Fever with elevated ANC.  UA is negative.  Chest x-ray negative.  KUB      pending.  Will get blood cultures x2 and follow abdominal pain when      patient is awake.  CBC in a.m.  Tylenol for fevers.  With abdominal pain      concern for appendicitis, but will await better history.  3.  Abdominal pain.  Abdominal examination is not concerning but patient is      status post Dilaudid.  Will await patient waking up more.  Differential      diagnosis appendicitis, DKA.  Likely pancreatitis given lactase.  Recent      ultrasound of gallbladder within normal limits but with elevated LFTs      and bilirubin will check acute hepatitis panel.  4.  LFTs and elevated bilirubin.  Will check a hepatitis panel.  Direct       bilirubin is up, therefore likely secondary to hemolysis.  Will check      LDH, haptoglobin, Coomb's.  African-American male, but no history of      sickle cell disease.  5.  Tachycardia likely secondary to dehydration.  This is sinus tachycardia.      Patient was given a bolus of normal saline.  Will try to increase IV      fluids, but will also keep a beta block and monitor if pulse does not      decrease.  Will also check a UDS as this can occur with cocaine.  Will      check cardiac enzymes as acute coronary syndrome can cause tachycardia      and DKA, although this is unlikely given his age and negative Cardiolite      in 2002.  Check an EKG and TSH.  6.  Pulsatile abdomen likely secondary to body      habitus/dehydration/tachycardia.  Will reexamine in a.m.  Concern for      aortic aneurysm given normal abdominal ultrasound one month ago and age,      but will get an ultrasound to rule out aortic aneurysm.      Rolm Gala, M.D.    ______________________________  Pearlean Brownie, M.D.    Bennetta Laos  D:  10/31/2005  T:  10/31/2005  Job:  [id number]

## 2011-02-15 NOTE — Discharge Summary (Signed)
[name], MINIX           ACCOUNT NO.:  [id number]   MEDICAL RECORD NO.:  [id number]          PATIENT TYPE:  INP   LOCATION:  5729                         FACILITY:  [facility]   PHYSICIAN:  Leighton Roach McDiarmid, M.D.DATE OF BIRTH:  Dec 05, 1970   DATE OF ADMISSION:  11/24/2005  DATE OF DISCHARGE:  11/27/2005                                 DISCHARGE SUMMARY   PRIMARY CARE PHYSICIAN:  Rolm Gala, M.D.   CONSULTATIONS:  None.   PROCEDURE:  None.   DISCHARGE DIAGNOSES:  1.  Diabetic ketoacidosis.  2.  Type 1 diabetes.  3.  Abdominal pain.  4.  Nausea and vomiting.  5.  Anemia of chronic disease.  6.  History of pancreatitis in fall of 2006.   LABORATORY DATA:  CBC with differential on February 25th showed white blood  cell count of 12.2, H&H 12.5/37.6, platelet count 250,000. ANC of 9.6.  Lipase was normal at 20. UA showed greater than 1000 glucose, positive  ketones, negative protein and was otherwise normal. BMET showed sodium of  142, potassium 4.3, chloride 111, bicarbonate 21, glucose 275, BUN 26,  creatinine 1.5, calcium 10.1, LDH of 148, magnesium 2, phosphorus 4.4,  ferritin 918. Bilirubin total 1.3. Urine drug screen was negative. Blood  cultures were negative times two. H. pylori was negative. The patient had a  low potassium of 2.9 on November 25, 2005, with potassium supplementation  that came back up to 3.7. The patient's AST was 43, ALT 55. Urine culture  was negative and folic acid was normal at 12.   HOSPITAL COURSE:  This is a 40 year old male was recently discharged with  DKA who came in again with DKA.   Problem #1:  DKA.  The patient was put on glucomander and his gap closed,  went from 23 to 3.  He was switched over to a home dose of Novolin 70/30, 30  units each morning and 15 units in the evening, and also covered with  sliding scale insulin. The patient had a few episodes of hypoglycemia. He  was given amps of D-50 and it came up well. At discharge  the patient was  told to only decrease his insulin to 22 units each morning and 15 units each  evening if he is not eating well.   Problem #2:  Diabetes, type 1. As above.   Problem #3:  Abdominal pain. The patient had abdominal pain. It was mid  epigastric which had mostly resolved at the time of discharge. CT of the  abdomen and pelvis was negative.   Problem #4:  Nausea and vomiting. This resolved with resolution of his DKA.   Problem #5:  Weight loss. Per patient, he has had a 30 pound weight loss. I  wonder if this may be related to his diabetes. I told him it was okay to  take sugar-free protein shakes as it is thought this might help.   Problem #6:  Anemia of chronic disease. The patient's hematocrit was within  normal limits.   Problem #7:  History of pancreatitis. No problems upon admission.   DISCHARGE MEDICATIONS:  1.  Novolin 70/30, 23 units each morning, 16 units each p.m.  2.  Multivitamin daily.   The patient was told to follow up with Dr. Gavin Potters of family practice on  March 19th at 9 a.m.      Rolm Gala, M.D.    ______________________________  Leighton Roach McDiarmid, M.D.    Bennetta Laos  D:  11/27/2005  T:  11/27/2005  Job:  [id number]

## 2011-02-15 NOTE — Discharge Summary (Signed)
[name], [name] NO.:  [id number]   MEDICAL RECORD NO.:  [id number]          PATIENT TYPE:  INP   LOCATION:  2035                         FACILITY:  [facility]   PHYSICIAN:  Isidor Holts, M.D.  DATE OF BIRTH:  Oct 29, 1970   DATE OF ADMISSION:  07/31/2005  DATE OF DISCHARGE:  08/03/2005                                 DISCHARGE SUMMARY   DISCHARGE DIAGNOSES:  1.  Type 1 diabetes mellitus.  2.  Diabetic ketoacidosis.  3.  Acute pancreatitis.  4.  History of chronic headache.   DISCHARGE MEDICATIONS:  1.  Humulin (70/30) 14 units subcutaneously q.a.m. and 20 units      subcutaneously at suppertime.  2.  Sliding scale insulin with Novolog subcutaneously as follows:  CBG 60-      100 = 0 insulin, CBG 101-150 = 1 unit, CBG 151-200 = 2 units, CBG 201-      250 = 4 units, CBG 251-300 = 6 units, CBG 301-350 = 8 units, CBG over      350 = 10 units.  3.  Protonix 40 mg p.o. daily x1 week.   PROCEDURES:  1.  Abdominal/chest x-ray dated July 31, 2005.  This showed mild      constipation, no obstruction or free air.  No acute cardiopulmonary      disease.  2.  Abdominal ultrasound scan on August 02, 2005.  This showed normal      abdominal ultrasound, small right pleural effusion.   CONSULTATIONS:  None.   HISTORY:  As in H&P notes of July 31, 2005, however, in brief, this is a  40 year old male, with a known history of type 1 diabetes mellitus who  presents with a 1-1/2 day history of nausea and vomiting, unable to keep any  food down, also mild abdominal pain.  Initial laboratory evaluation, showed  a sodium of 128, glucose 573, and anion gap 12.  Potassium 6.8, bicarbonate  of 12, creatinine 1.5.  It was felt that these findings were consistent with  a diagnosis of diabetic ketoacidosis, against the background of type 1  diabetes mellitus.  The patient was therefore admitted for further  evaluation, investigation, and management.   HOSPITAL COURSE:  #1  -  TYPE 1 DIABETES MELLITUS COMPLICATED BY DIABETIC  KETOACIDOSIS:  The patient presents with a 1-1/2 to 2 day history of  abdominal pain, associated with nausea and vomiting, unable to keep food  down.  No clinical signs of infection.  Laboratory findings were consistent  with diabetic ketoacidosis.  The patient was therefore managed with  aggressive intravenous fluid hydration and placed on intravenous infusion of  insulin per Glucomander protocol.  Anti-emetics were also utilized.  Clinical response was rapid and satisfactory.  By August 01, 2005, CBG  levels had declined gradually to 128, electrolytes were within normal  limits, BUN was 28, creatinine was 1.4, and anion gap was closed.  Calculated anion gap was 5.  The patient was therefore switched to scheduled  subcutaneous insulin in pre-admission dosages resulting in euglycemia.  He  also had sliding scale insulin coverage and was placed on  a carbohydrate  modified diet as he no longer was vomiting.   #2 -  ACUTE PANCREATITIS:  Review of laboratory tests dated August 01, 2005, showed a lipase level which was elevated at 288.  It is felt that this  was consistent with acute pancreatitis and may have been the precipitant of  the patient's acute presentation, seen in #1 above.  The patient states that  his diabetes mellitus had been well controlled and that he has not been  admitted to the hospital for years.  He was managed with low fat diet which  he was able to tolerate, anti-emetics were continued, and also proton pump  inhibitor treatment.  In the succeeding few days, the patient remained  asymptomatic, was able to manage adequate dietary intake.  His lipase level  dropped to 40 on August 02, 2005, and on August 03, 2005, lipase was  entirely normal at 36.   #3 -  HISTORY OF CHRONIC HEADACHES:  This did not prove problematic, during  the course of the patient's hospital stay.   DISPOSITION:  The patient was discharged in  satisfactory condition on  August 03, 2005.  He is expected to return to work on August 08, 2005.   DIET:  Carbohydrate modified/low-fat for approximately one week, and then,  subsequently, just carbohydrate modified.   ACTIVITY:  Advised to increase activity slowly.   WOUND CARE:  Not applicable.   PAIN MANAGEMENT:  Not applicable.   FOLLOWUP:  The patient is recommended to establish a primary M.D. with  [facility] within one week.  Appropriate phone numbers have been given to  the patient.  He has verbalized understanding.   SPECIAL INSTRUCTIONS:  The patient is instructed to maintain a low fat diet  for approximately one week.      Isidor Holts, M.D.  Electronically Signed     CO/MEDQ  D:  08/03/2005  T:  08/03/2005  Job:  [id number]   cc:   [facility]

## 2011-02-15 NOTE — Discharge Summary (Signed)
[name], [name] NO.:  [id number]   MEDICAL RECORD NO.:  [id number]          PATIENT TYPE:  INP   LOCATION:  6702                         FACILITY:  [facility]   PHYSICIAN:  Pearlean Brownie, M.D.DATE OF BIRTH:  1971/05/24   DATE OF ADMISSION:  10/30/2005  DATE OF DISCHARGE:  11/01/2005                                 DISCHARGE SUMMARY   DISCHARGE DIAGNOSIS:  1.  Diabetic ketoacidosis, resolved.  2.  Type 1 diabetes.  3.  Sinusitis.  4.  Gastroenteritis, resolved.  5.  Anemia.  6.  History of pancreatitis in the fall 2006.  7.  Chronic headaches.   DISCHARGE MEDICATIONS:  1.  Protonix 40 mg p.o. b.i.d., prescription given.  2.  Amoxicillin 1 gram p.o. t.i.d. to be finished on November 04, 2005,      prescription given.  3.  Novolin 70/30, 30 units subcu q.a.m. and 20 units subcu q.a.c.,      prescription given.  4.  Multi-vitamin.   CONDITION ON DISCHARGE:  Good.   DISCHARGE INSTRUCTIONS:  The patient is to follow up with [facility] on Wednesday, February 7 at 10:30 a.m.   PROCEDURES:  1.  Abdominal ultrasound on October 30, 2005, that was within normal limits.  2.  Abdominal CT with contrast that was negative and did not show symptoms      of pancreatitis or appendicitis.  3.  Non-contrast pelvic CT on October 30, 2005, that was negative.  4.  Non-contrast head CT that was negative of the brain but did show ethmoid      and maxillary sinus disease with probable acute inflammatory component.  5.  Chest x-ray that was negative on October 30, 2005.  6.  Abdominal x-ray on October 30, 2005, that was normal with no      calcifications.   BRIEF ADMISSION HISTORY AND PHYSICAL:  Please see full dictation in chart.  Briefly, this is a 40 year old African American male with type 1 diabetes  and multiple hospital admissions for DKA who was admitted for diabetic  ketoacidosis after a four day history of nausea, vomiting, and abdominal   pain.   HOSPITAL COURSE:  Problem 1:  Diabetic ketoacidosis.  When the patient was seen by the primary  care team for admission, he was status post Dilaudid for abdominal pain and  quite drowsy.  A non-contrast head CT was subsequently ordered that was  negative except for sinusitis.  His mental status had returned to baseline  within hours and the patient was subsequently admitted to a step down unit  and started on IV fluids with KCL and Glucomander.  His anion gap closed  within 12 hours and he was switched over to his home regimen of 30 units  Novolin 70/30 20 units q.a.m. and 20 units q.h.s.  CBGs on the day of  discharge were in the 160s.  Potassium and magnesium were both replaced as  needed and he was discharged home on a multi-vitamin.  He was tolerating  p.o. well on the day of discharge.  He denied vomiting or diarrhea.  He was  educated on the importance of medication compliance.  This episode of DKA  was likely secondary to infection, either sinusitis, gastroenteritis, or  both.  Will check CBGs at home and follow up next week for possible change  to his treatment plan.  A1C was 8.6.   Problem 2:  Abdominal pain with fever and elevated white count.  Abdominal  pain decreased from 8 out of 10 on admission to 4 out of 10 on the day of  discharge.  No rebound, rigidity, or guarding or signs of peritonitis during  admission.  Abdominal CT and ultrasound did not show evidence of  pancreatitis, appendicitis, or any stones.  He had a negative urinalysis.  The patient remained afebrile and his white blood cell count returned to  normal after antibiotics were started.  Originally, he was started on  vancomycin and gentamicin in the ED but switched to amoxicillin once  sinusitis was the only identified source of infection.  Lipase and amylase  were normal.  LFTs were slightly elevated but his hepatitis panel was  negative.  Likely, this abdominal pain was a combination of DKA and   gastroenteritis.  The patient's abdominal pain was relieved somewhat by PPI.  He was discharged home on Protonix b.i.d. and instructed to return to the ED  for worsening pain, nausea, vomiting, or fever.  Will follow up with family  practice clinic next week to repeat LFTs.   Problem 3:  Asymptomatic sinus tachycardia.  The patient was tachycardic in  the emergency room  with a pulse of 158.  This was thought to be SVT and he  was subsequently given Adenosine.  The patient remained tachycardic,  however, until given multiple liters of fluid.  A UDS was negative, TSH was  normal, EKG showed normal sinus tachycardia without ST segment changes.  Cardiac enzymes x 1 were negative and he has a history of a negative  Cardiolite in 2002.  He never complained of chest pain or shortness of  breath.  On the day of discharge, his heart rate had returned to normal x 24  hours.  Likely etiology from dehydration secondary to DKA and  gastroenteritis.   Problem 4:  Normocytic anemia.  The patient was found to be anemic with a  hemoglobin of 10.6 on the day of discharge, this is new onset for him.  Differential diagnosis was hemolysis versus iron deficiency versus chronic  disease.  No history of sickle cell trait or thalassemia.  His T-bili and  haptoglobin were both normal.  IGG and complement were negative, guaiac  negative.  Vitamin B12 and folate, iron, and percent saturation were all  normal.  His TIBC was low, however, consistent with chronic disease.  Will  follow up with repeat CBC next week in clinic.   Problem 5:  Sinusitis.  As above, diagnosed by CT, discharge home on  amoxicillin for a total of five days of treatment.   DISCHARGE LABS AND VITAL SIGNS:  On the day of discharge, he was afebrile  with stable vital signs.  Pertinent lab values revealed negative urinalysis,  white blood cell count 5.2, hemoglobin 10.6, hematocrit 31.4, MCV 80.7, platelet count 217, IGG and complement  negative, guaiac negative, iron 63,  TIBC 201, percent saturation 31, UIBC 138, vitamin B12 410, folate 13.9,  haptoglobin 54.  Cardiac enzymes negative x 1.  Sodium 145, potassium 3.2,  prior to administration of K-Dur, chloride 113, CO2 27, glucose 124, BUN 10,  creatinine 1, total  bilirubin 0.6, alkaline phos 57, AST 51, ALT 49, total  protein 5.5, albumin 2.5, calcium 9.3, magnesium 1.6, amylase 25, lipase 29.  Hep panel negative.  UDS negative.  TSH less than 0.004.  Hemoglobin A1C  8.6.  There are no pending labs.    ______________________________  Cam Hai    ______________________________  Pearlean Brownie, M.D.   KS/MEDQ  D:  11/01/2005  T:  11/01/2005  Job:  [id number]

## 2011-02-15 NOTE — Consult Note (Signed)
NAMESALAM, MICUCCI NO.:  [id number]   MEDICAL RECORD NO.:  [id number]          PATIENT TYPE:  EMS   LOCATION:  MAJO                         FACILITY:  [facility]   PHYSICIAN:  Georgann Housekeeper, MD      DATE OF BIRTH:  1971-02-16   DATE OF CONSULTATION:  DATE OF DISCHARGE:  05/22/2006                                   CONSULTATION   CHIEF COMPLAINT:  Some nausea and chills, elevated blood sugar.   PRIMARY CARE PHYSICIAN:  Dr. Ladell Pier, M.D. __________ Patsi Sears.   HISTORY OF PRESENT ILLNESS:  A 40 year old male with history of type 1  diabetes and multiple admissions in the past for elevated blood sugars and  DKA.  Complained of nausea and chills and feeling bad for a couple of days.  Had a little bit of toe pain on the right third toe.  Blood sugars in the  200 to 300 range.  He has been using more sliding scale.  A little poor  appetite for the last two days.  In the ER, his heart rate was elevated in  the 120s.  At the time of exam, was 90s.  Evaluated for these complaints.  No fevers but positive chills, positive nausea.  No vomiting.  No chest pain  and no shortness of breath.   ALLERGIES:  None.   MEDICATIONS:  1. Novolin 70/30, 30 units in the morning, 15 in the evening.  2. Sliding-scale insulin with the NovoLog.  3. Multivitamin.   PAST MEDICAL HISTORY:  1. Type 1 diabetes.  2. Anemia of chronic disease.  3. A history of pancreatitis in 2006.  4. Chronic headaches.   SOCIAL HISTORY:  No tobacco or alcohol.  He works at the __________ on  [employer].  Single.   FAMILY HISTORY:  Positive for diabetes.   PHYSICAL EXAMINATION:  Temperature 98.3, blood pressure 146/42, pulse  initially was 120, on exam was 90, respiratory rate 16, 97% sat's.  By the  time of the exam, the patient was feeling better mucous membranes moist.  The lungs were clear.  Cardiovascular exam:  Regular S1 and S2 without any  murmurs.  Abdomen was soft, good bowel  sounds.  On the right third toe, he  had a little bit of redness and swelling, slightly tender.   LABORATORY DATA:  UA negative.  White count 9.1, hemoglobin 12.3.  Chemistry  is normal with BUN of 23, creatinine 1.1, sugar of 266, sodium 135.   Chest x-ray negative.   Toe x-ray did not show any evidence of any osteal.   A 40 year old male with type 1 diabetes, elevated blood sugars and some  chills with right toe swollen and red.   PROBLEMS:  1. Most likely an infection on the toe.  There is no evidence of an      osteal.  2. Elevated sugars because of the viral illness versus the toe, white      count being normal.   PLAN:  1. Discharge home with Keflex 500 t.i.d.  2. Elevated the toe.  3. Phenergan p.r.n. for nausea.  4. Increase insulin  to 70/30 - 30 in the morning, 20 in the evening.  5. Continue with sliding scale.  6. Increase fluid intake.  7. Follow up with Dr. Olena Leatherwood in one day on May 27, 2006.      Georgann Housekeeper, MD  Electronically Signed     KH/MEDQ  D:  05/22/2006  T:  05/23/2006  Job:  [id number]
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
## 2011-02-15 NOTE — H&P (Signed)
[name], [name] NO.:  [id number]   MEDICAL RECORD NO.:  [id number]           PATIENT TYPE:   LOCATION:                               FACILITY:  [facility]   PHYSICIAN:  Lonia Blood, M.D.       DATE OF BIRTH:  1971/07/29   DATE OF ADMISSION:  07/31/2005  DATE OF DISCHARGE:                                HISTORY & PHYSICAL   CHIEF COMPLAINT:  Nausea and vomiting.   HISTORY OF PRESENT ILLNESS:  [name] is a 40 year old African American  man with a history of type 1 diabetes.  He presented to [facility] Emergency  Room for one and a half days of nausea vomiting to the point where he was  unable to keep anything down.  He did not take his insulin because of that.  He also reports a mild cough with some sputum production.  No chest pain.  He also has some mild abdominal pain but no diarrhea.   PAST MEDICAL HISTORY:  1.  Diabetes mellitus, type 1, for 16 years.  2.  Infected hematoma in 2001.   HOME MEDICATIONS:  Insulin 70/30 at 40 units in the morning and 20 units  before dinner.   He has no known drug allergies.   SOCIAL HISTORY:  He does not drink alcohol.  He does not smoke tobacco.  Works at a gas station.  He is single.   FAMILY HISTORY:  His mother is alive, age 60, with diabetes.  Father  unknown.  He has two brothers; they are both healthy.   REVIEW OF SYSTEMS:  As per HPI, all other systems are negative.   PHYSICAL EXAMINATION:  VITAL SIGNS:  Temperature is 99.2, pulse 115,  respirations 20, blood pressure 126/56.  He is saturating 100% on room air.  GENERAL:  He appears well developed, well nourished, in no acute distress,  lying in bed.  HEENT:  Head is normocephalic, atraumatic.  Eyes:  Pupils equal and round,  reactive to light and accommodation.  Extraocular movements intact.  He has  got some anicteric sclerae.  Throat is clear.  Mouth without ulcerations.  NECK:  Supple without JVD.  No carotid bruits.  LUNGS:  Clear to auscultation  without wheezes, rhonchi, or crackles.  HEART:  Tachycardia with an S3 gallop and no murmurs.  ABDOMEN:  Soft.  Bowel sounds are diminished.  He has got some diffuse  tenderness on deep palpation of the abdomen all through.  No guarding.  GENITOURINARY:  He has got no CVA tenderness.  EXTREMITIES:  No edema.  Pulses present bilaterally.  He has got good muscle  bulk and tone.  LYMPHATIC:  There is no palpable lymphadenopathy.  NEUROLOGIC:  Cranial nerves II-XII are intact.  Strength 5/5.  Sensation is  intact.  SKIN:  Warm and dry.   LABORATORY VALUES:  Sodium is 128, potassium 6.8, chloride 104, bicarb 12,  creatinine 1.5, glucose 573.  Anion gap is about 12.  White blood cell count  is 20, hemoglobin 13, platelet count 234, absolute neutrophil count 17.   IMPRESSION:  1.  Diabetic  ketoacidosis.  Plan is to obtain an acetone blood level and a      urinalysis, start the patient on intravenous fluids, and intravenous      insulin, check his B-MET frequently every four hours.  2.  Hyperkalemia most likely secondary to severe acidosis.  The patient      received calcium bicarbonate from the emergency room physician.  We will      follow up the potassium level.  3.  Abdominal pain.  This could be  due to diabetic ketoacidosis but to be      cautious we will proceed with some further workup with a complete      metabolic profile, a lipase level, acute abdominal series.  If after      correction of the diabetic ketoacidosis the abdominal pain persists, we      will proceed with a computed tomography scan of the abdomen to rule out      acute process like appendicitis.  4.  Dehydration with acute renal insufficiency.  Aggressive intravenous      fluid hydration will be pursued and further creatinine will be checked.  5.  Deep vein thrombosis prophylaxis will be done with Lovenox.      Gastrointestinal prophylaxis will be done with Protonix.      Lonia Blood, M.D.  Electronically  Signed     SL/MEDQ  D:  07/31/2005  T:  07/31/2005  Job:  [id number]

## 2012-04-21 ENCOUNTER — Encounter (HOSPITAL_COMMUNITY): Payer: Self-pay

## 2012-04-21 ENCOUNTER — Emergency Department (HOSPITAL_COMMUNITY)
Admission: EM | Admit: 2012-04-21 | Discharge: 2012-04-21 | Disposition: A | Discharge status: Home / Self Care | Payer: Self-pay | Attending: Emergency Medicine | Admitting: Emergency Medicine

## 2012-04-21 DIAGNOSIS — K612 Anorectal abscess: Secondary | ICD-10-CM

## 2012-04-21 DIAGNOSIS — K611 Rectal abscess: Secondary | ICD-10-CM

## 2012-04-21 LAB — BASIC METABOLIC PANEL
BUN: 13 mg/dL (ref 6–23)
GFR calc Af Amer: >90 mL/min (ref >90)
GFR calc non Af Amer: 88 mL/min — ABNORMAL LOW (ref >90)
Potassium: 3.7 mEq/L (ref 3.5–5.1)

## 2012-04-21 LAB — CBC
Hemoglobin: 13.8 g/dL (ref 13.0–17.0)
MCHC: 33.6 g/dL (ref 30.0–36.0)
RDW: 13.1 % (ref 11.5–15.5)

## 2012-04-21 MED ORDER — MORPHINE SULFATE 4 MG/ML IJ SOLN: 6.0000 mg | Freq: Once | INTRAMUSCULAR | Status: DC

## 2012-04-21 MED ORDER — DOXYCYCLINE HYCLATE 100 MG PO CAPS: 100.0000 mg | ORAL_CAPSULE | Freq: Two times a day (BID) | ORAL | Status: AC

## 2012-04-21 MED ORDER — SODIUM CHLORIDE 0.9 % IV SOLN: INTRAVENOUS | Status: DC

## 2012-04-21 MED ORDER — OXYCODONE-ACETAMINOPHEN 5-325 MG PO TABS: 1.0000 | ORAL_TABLET | ORAL | Status: AC | PRN

## 2012-04-21 NOTE — ED Notes (Signed)
Pt complains of swelling at recturm

## 2012-04-21 NOTE — ED Provider Notes (Signed)
Medical screening examination/treatment/procedure(s) were conducted as a shared visit with non-physician practitioner(s) and myself.  I personally evaluated the patient during the encounter  Doug Sou, MD 04/21/12 1537

## 2012-04-21 NOTE — ED Provider Notes (Addendum)
History  This chart was scribed for Doug Sou, MD by Ladona Ridgel Day. This patient was seen in room TR04C/TR04C and the patient's care was started at 1232.   CSN: [id number]  Arrival date & time 04/21/12  1232   First MD Initiated Contact with Patient 04/21/12 1242      Chief Complaint  Patient presents with  . Hemorrhoids    The history is provided by the patient. No language interpreter was used.   [name] is a 41 y.o. male who presents to the Emergency Department complaining of rectal pain for past 3 days. He states his rectal pain is worse with sitting and wiping. He had a BM last night and states it was painful. He denies any fever or taking medicine for his symptoms. He denies drinking alcohol or smoking. Pain worse with walking or with bowel movements      No past medical history on file. Diabetes, hypothyroid   No past surgical history on file.  No family history on file.  History  Substance Use Topics  . Smoking status: Not on file  . Smokeless tobacco: Not on file  . Alcohol Use: Not on file   no tobacco no alcohol no drugs    Review of Systems  Constitutional: Negative.   HENT: Negative.   Respiratory: Negative.   Cardiovascular: Negative.   Gastrointestinal: Positive for rectal pain. Negative for vomiting, diarrhea, constipation and blood in stool.  Musculoskeletal: Negative.   Skin: Negative.   Neurological: Negative.   Hematological: Negative.   Psychiatric/Behavioral: Negative.     Allergies  Review of patient's allergies indicates not on file.  Home Medications  No current outpatient prescriptions on file.  BP 136/89  Pulse 74  Temp 98.5 F (36.9 C) (Oral)  Resp 18  SpO2 96%  Physical Exam  Nursing note and vitals reviewed. Constitutional: He appears well-developed and well-nourished. He appears distressed.       Appears uncomfortable  HENT:  Head: Normocephalic and atraumatic.  Eyes: Conjunctivae are normal. Pupils are  equal, round, and reactive to light.  Neck: Neck supple. No tracheal deviation present. No thyromegaly present.  Cardiovascular: Normal rate and regular rhythm.   No murmur heard. Pulmonary/Chest: Effort normal and breath sounds normal.  Abdominal: Soft. Bowel sounds are normal. He exhibits no distension. There is no tenderness.  Genitourinary: Penis normal.       Perirectal abscess and nine clock, draining frank cream colored pus  Musculoskeletal: Normal range of motion. He exhibits no edema and no tenderness.  Neurological: He is alert. Coordination normal.  Skin: Skin is warm and dry. No rash noted.       He has a draining peri-rectal abscess.   Psychiatric: He has a normal mood and affect.    ED Course  Procedures (including critical care time) DIAGNOSTIC STUDIES: Oxygen Saturation is 96% on room air, normal by my interpretation.    COORDINATION OF CARE: At 1249 Discussed treatment plan with patient which includes pain medicine and consult with a surgeon. Patient agrees.   At 100 PM I discussed   Labs Reviewed - No data to display No results found. Results for orders placed during the hospital encounter of 04/21/12  BASIC METABOLIC PANEL      Component Value Range   Sodium 138  135 - 145 mEq/L   Potassium 3.7  3.5 - 5.1 mEq/L   Chloride 103  96 - 112 mEq/L   CO2 27  19 - 32 mEq/L  Glucose, Bld 152 (*) 70 - 99 mg/dL   BUN 13  6 - 23 mg/dL   Creatinine, Ser 1.61  0.50 - 1.35 mg/dL   Calcium 8.9  8.4 - 09.6 mg/dL   GFR calc non Af Amer 88 (*) >90 mL/min   GFR calc Af Amer >90  >90 mL/min  CBC      Component Value Range   WBC 6.9  4.0 - 10.5 K/uL   RBC 4.98  4.22 - 5.81 MIL/uL   Hemoglobin 13.8  13.0 - 17.0 g/dL   HCT 04.5  40.9 - 81.1 %   MCV 82.5  78.0 - 100.0 fL   MCH 27.7  26.0 - 34.0 pg   MCHC 33.6  30.0 - 36.0 g/dL   RDW 91.4  78.2 - 95.6 %   Platelets 168  150 - 400 K/uL   No results found.   No diagnosis found.  General surgery called by me to see  patient in the emergency department.  MDM  Plan pain control Laboratory studies pending will be checked by mid-level in CDU Disposition per general surgery Diagnosis perirectal abscess   I personally performed the services described in this documentation, which was scribed in my presence. The recorded information has been reviewed and considered.       Doug Sou, MD 04/21/12 1303  Doug Sou, MD 04/21/12 1537

## 2012-04-21 NOTE — ED Provider Notes (Signed)
Patient seen for perirectal abscess. Cardiac: Normal rate ryhtmns, Pulmonary: CTA. Abdomen: Bowel sounds present in all quadrants without tenderness.  Abscess is small pencil eraser sized at the right medial aspect of the buttock. Draining puruluent foul smelling discharge and appears to be fully drained. Patient has no red flags for  cellultis. Per Grayland Jack, [facility] surgical consult.  Recommendations from surgical consult: Discharge on Doxy 100 BID for 10 days. In addition to sitz baths and pain medication.  Patient given return precautions verbally and in discharge instructions.  Pixie Casino, PA-C 04/21/12 1519

## 2012-04-21 NOTE — Consult Note (Signed)
Reason for Consult: Rectal pain Referring Physician: Doug Sou, MD    [name] is an 41 y.o. male.  HPI: who presents to the Emergency Department complaining of rectal pain for past 3 days. He states his rectal pain is worse with sitting and wiping. He had a BM last night and states it was painful. He denies any fever or taking medicine for his symptoms. He denies drinking alcohol or smoking. Pain worse with walking or with bowel movements. We are asked to see him on consult for evaluation of his perirectal abscess. On examination it appears that his abscess had already spontaneously drained. There was a small amount of tan purulent fluid present, but there was no evidence of any cellulitis, or extension of his abscess on exam. It was limited to a small punctate wound about the size of a pencil eraser in the medial aspect of the right buttock.   No past medical history on file.  No past surgical history on file.  No family history on file.  Social History:  does not have a smoking history on file. He does not have any smokeless tobacco history on file. His alcohol and drug histories not on file.  Allergies: No Known Allergies  Medications: I have reviewed the patient's current medications.  No results found for this or any previous visit (from the past 48 hour(s)).  No results found.  Review of Systems  Constitutional: Negative.   HENT: Negative.   Eyes: Negative.   Respiratory: Negative.   Cardiovascular: Negative.   Gastrointestinal: Negative.   Genitourinary: Negative.   Musculoskeletal: Negative.   Skin: Negative.   Neurological: Negative.   Endo/Heme/Allergies: Negative.   Psychiatric/Behavioral: Negative.    Blood pressure 136/89, pulse 74, temperature 98.5 F (36.9 C), temperature source Oral, resp. rate 18, SpO2 96.00%. Physical Exam  Constitutional: He is oriented to person, place, and time. He appears well-developed and well-nourished.  HENT:    Head: Normocephalic and atraumatic.  Eyes: Conjunctivae and EOM are normal. Pupils are equal, round, and reactive to light. Right eye exhibits no discharge. Left eye exhibits no discharge. No scleral icterus.  Neck: Normal range of motion. Neck supple. No JVD present. No tracheal deviation present. No thyromegaly present.  Cardiovascular: Normal rate, regular rhythm and normal heart sounds.  Exam reveals no gallop and no friction rub.   No murmur heard. Respiratory: Effort normal and breath sounds normal. No stridor. No respiratory distress. He has no wheezes. He has no rales. He exhibits no tenderness.  GI: Soft. Bowel sounds are normal. He exhibits no distension and no mass. There is no tenderness. There is no rebound and no guarding.  Genitourinary: Rectum normal. Guaiac negative stool.  Musculoskeletal: Normal range of motion.  Lymphadenopathy:    He has no cervical adenopathy.  Neurological: He is alert and oriented to person, place, and time.  Skin: Skin is warm and dry. No rash noted. No erythema. No pallor.  Psychiatric: He has a normal mood and affect.  He remains afebrile, and VSS. Labs appear WNL.   Assessment/Plan: Perirectal abcess 1. Discharge to home/self care (discussed plan of care with ED PA and patient). 2. Sitz baths 3. ABX p.o. 4. Follow up with our office in 2 weeks time with Dr. Luisa Hart.   Zackarie Chason 04/21/2012, 2:03 PM

## 2012-04-21 NOTE — Consult Note (Signed)
Pt to follow up with me in 2 weeks.  It seems to have already drained. Antibiotics and wound care.

## 2012-12-10 ENCOUNTER — Ambulatory Visit (INDEPENDENT_AMBULATORY_CARE_PROVIDER_SITE_OTHER): Payer: BC Managed Care – PPO | Admitting: Emergency Medicine

## 2012-12-10 VITALS — BP 122/82 | HR 67 | Temp 98.1°F | Resp 16 | Ht 72.0 in | Wt 209.0 lb

## 2012-12-10 DIAGNOSIS — A599 Trichomoniasis, unspecified: Secondary | ICD-10-CM

## 2012-12-10 DIAGNOSIS — R21 Rash and other nonspecific skin eruption: Secondary | ICD-10-CM

## 2012-12-10 DIAGNOSIS — B49 Unspecified mycosis: Secondary | ICD-10-CM

## 2012-12-10 LAB — POCT SKIN KOH: Skin KOH, POC: POSITIVE

## 2012-12-10 NOTE — Progress Notes (Signed)
  Subjective:    Patient ID: [name], male    DOB: 05/02/1971, 42 y.o.   MRN: [id number]  HPI Patient comes in today with a rash on his arms, chest, back and arms. He complains that they start out itching but once they start to scab and heal they stop. They feel like dry skin. The start out looking like a bump with a pus under the skin. This all started a few months ago. Pt reports no environmental changes and he denies ever seeing an insect on him.    Review of Systems     Objective:   Physical Exam   Pt has three one cent raised red scaly areas on both arms. KOH testing revieled positive Hyphae.      Assessment & Plan:   Will treat tropically with Lotrimin Cream.

## 2012-12-10 NOTE — Patient Instructions (Signed)
Fungus Infection of the Skin  An infection of your skin caused by a fungus is a very common problem. Treatment depends on which part of the body is affected. Types of fungal skin infection include:  · Athlete's Foot(Tinea pedis). This infection starts between the toes and may involve the entire sole and sides of foot. It is the most common fungal disease. It is made worse by heat, moisture, and friction. To treat, wash your feet 2 to 3 times daily. Dry thoroughly between the toes. Use medicated foot powder or cream as directed on the package. Plain talc, cornstarch, or rice powder may be dusted into socks and shoes to keep the feet dry. Wearing footwear that allows ventilation is also helpful.  · Ringworm (Tinea corporis and tinea capitis). This infection causes scaly red rings to form on the skin or scalp. For skin sores, apply medicated lotion or cream as directed on the package. For the scalp, medicated shampoo may be used with with other therapies. Ringworm of the scalp or fingernails usually requires using oral medicine for 2 to 4 months.  · Tinea versicolor. This infection appears as painless, scaly, patchy areas of discolored skin (whitish to light brown). It is more common in the summer and favors oily areas of the skin such as those found at the chest, abdomen, back, pubis, neck, and body folds. It can be treated with medicated shampoo or with medicated topical cream. Oral antifungals may be needed for more active infections. The light and/or dark spots may take time to get better and is not a sign of treatment failure.  Fungal infections may need to be treated for several weeks to be cured. It is important not to treat fungal infections with steroids or combination medicine that contains an antifungal and steroid as these will make the fungal infection worse.  SEEK MEDICAL CARE IF:   · You have persistent itching or rawness.  · You have an oral temperature above 102° F (38.9° C).  Document Released:  10/24/2004 Document Revised: 12/09/2011 Document Reviewed: 01/09/2010  [employer]® Patient Information ©2013 [employer].

## 2015-01-26 ENCOUNTER — Encounter (HOSPITAL_COMMUNITY): Payer: Self-pay | Admitting: Emergency Medicine

## 2015-01-26 ENCOUNTER — Emergency Department (HOSPITAL_COMMUNITY)
Admission: EM | Admit: 2015-01-26 | Discharge: 2015-01-26 | Disposition: A | Discharge status: Home / Self Care | Payer: BLUE CROSS/BLUE SHIELD | Attending: Emergency Medicine | Admitting: Emergency Medicine

## 2015-01-26 ENCOUNTER — Emergency Department (HOSPITAL_COMMUNITY): Payer: BLUE CROSS/BLUE SHIELD

## 2015-01-26 DIAGNOSIS — E079 Disorder of thyroid, unspecified: Secondary | ICD-10-CM | POA: Diagnosis not present

## 2015-01-26 DIAGNOSIS — Z79899 Other long term (current) drug therapy: Secondary | ICD-10-CM | POA: Insufficient documentation

## 2015-01-26 DIAGNOSIS — K2970 Gastritis, unspecified, without bleeding: Secondary | ICD-10-CM | POA: Insufficient documentation

## 2015-01-26 DIAGNOSIS — R1013 Epigastric pain: Secondary | ICD-10-CM | POA: Diagnosis present

## 2015-01-26 DIAGNOSIS — Z794 Long term (current) use of insulin: Secondary | ICD-10-CM | POA: Insufficient documentation

## 2015-01-26 DIAGNOSIS — E119 Type 2 diabetes mellitus without complications: Secondary | ICD-10-CM | POA: Diagnosis not present

## 2015-01-26 LAB — COMPREHENSIVE METABOLIC PANEL
ALT: 25 U/L (ref 0–53)
ANION GAP: 6 (ref 5–15)
AST: 24 U/L (ref 0–37)
Albumin: 3.5 g/dL (ref 3.5–5.2)
Alkaline Phosphatase: 57 U/L (ref 39–117)
BUN: 12 mg/dL (ref 6–23)
CALCIUM: 9.4 mg/dL (ref 8.4–10.5)
CO2: 27 mmol/L (ref 19–32)
CREATININE: 1.07 mg/dL (ref 0.50–1.35)
Chloride: 105 mmol/L (ref 96–112)
GFR calc non Af Amer: 84 mL/min — ABNORMAL LOW (ref >90)
GLUCOSE: 257 mg/dL — AB (ref 70–99)
Potassium: 4.2 mmol/L (ref 3.5–5.1)
SODIUM: 138 mmol/L (ref 135–145)
TOTAL PROTEIN: 7 g/dL (ref 6.0–8.3)
Total Bilirubin: 0.7 mg/dL (ref 0.3–1.2)

## 2015-01-26 LAB — CBC WITH DIFFERENTIAL/PLATELET
Basophils Absolute: 0 10*3/uL (ref 0.0–0.1)
Basophils Relative: 0 % (ref 0–1)
EOS ABS: 0.1 10*3/uL (ref 0.0–0.7)
EOS PCT: 1 % (ref 0–5)
HCT: 41.3 % (ref 39.0–52.0)
HEMOGLOBIN: 13.4 g/dL (ref 13.0–17.0)
LYMPHS ABS: 0.9 10*3/uL (ref 0.7–4.0)
Lymphocytes Relative: 12 % (ref 12–46)
MCH: 27.3 pg (ref 26.0–34.0)
MCHC: 32.4 g/dL (ref 30.0–36.0)
MCV: 84.1 fL (ref 78.0–100.0)
MONO ABS: 0.6 10*3/uL (ref 0.1–1.0)
MONOS PCT: 8 % (ref 3–12)
Neutro Abs: 6.3 10*3/uL (ref 1.7–7.7)
Neutrophils Relative %: 79 % — ABNORMAL HIGH (ref 43–77)
Platelets: 173 10*3/uL (ref 150–400)
RBC: 4.91 MIL/uL (ref 4.22–5.81)
RDW: 14 % (ref 11.5–15.5)
WBC: 7.9 10*3/uL (ref 4.0–10.5)

## 2015-01-26 LAB — URINALYSIS, ROUTINE W REFLEX MICROSCOPIC
BILIRUBIN URINE: NEGATIVE
Glucose, UA: >1000 mg/dL — AB
Hgb urine dipstick: NEGATIVE
Ketones, ur: NEGATIVE mg/dL
LEUKOCYTES UA: NEGATIVE
NITRITE: NEGATIVE
Protein, ur: NEGATIVE mg/dL
SPECIFIC GRAVITY, URINE: 1.045 — AB (ref 1.005–1.030)
UROBILINOGEN UA: 0.2 mg/dL (ref 0.0–1.0)
pH: 5.5 (ref 5.0–8.0)

## 2015-01-26 LAB — URINE MICROSCOPIC-ADD ON

## 2015-01-26 LAB — LIPASE, BLOOD: LIPASE: 20 U/L (ref 11–59)

## 2015-01-26 MED ORDER — SUCRALFATE 1 G PO TABS: 1.0000 g | ORAL_TABLET | Freq: Three times a day (TID) | ORAL | Status: DC

## 2015-01-26 MED ORDER — OMEPRAZOLE 20 MG PO CPDR: 20.0000 mg | DELAYED_RELEASE_CAPSULE | Freq: Two times a day (BID) | ORAL | Status: DC

## 2015-01-26 MED ORDER — GI COCKTAIL ~~LOC~~
30.0000 mL | Freq: Once | ORAL | Status: AC
  Administered 2015-01-26: 30 mL via ORAL
  Filled 2015-01-26: qty 30

## 2015-01-26 MED ORDER — ONDANSETRON HCL 4 MG/2ML IJ SOLN
4.0000 mg | Freq: Once | INTRAMUSCULAR | Status: AC
  Administered 2015-01-26: 4 mg via INTRAVENOUS
  Filled 2015-01-26: qty 2

## 2015-01-26 MED ORDER — MORPHINE SULFATE 4 MG/ML IJ SOLN
4.0000 mg | Freq: Once | INTRAMUSCULAR | Status: AC
  Administered 2015-01-26: 4 mg via INTRAVENOUS
  Filled 2015-01-26: qty 1

## 2015-01-26 MED ORDER — PANTOPRAZOLE SODIUM 40 MG IV SOLR
40.0000 mg | Freq: Once | INTRAVENOUS | Status: AC
  Administered 2015-01-26: 40 mg via INTRAVENOUS
  Filled 2015-01-26: qty 40

## 2015-01-26 NOTE — ED Provider Notes (Signed)
CSN: [id number]     Arrival date & time 01/26/15  0845 History   First MD Initiated Contact with Patient 01/26/15 [phone]     Chief Complaint  Patient presents with  . Abdominal Pain     (Consider location/radiation/quality/duration/timing/severity/associated sxs/prior Treatment) HPI Comments: Patient presents with abdominal pain. He states it started yesterday and was worse this morning. He describes a gnawing kind of pain in his epigastric area. He denies any nausea vomiting or diarrhea. He denies any radiation of the pain. He took some over-the-counter acid reducing medicine without relief. He's having normal bowel movements. He denies any fevers or chills. He denies any alcohol use. He denies any history of stomach problems in the past. He states the pain is been constant worsening since yesterday. He denies any known blood in his stool or black stools.  Patient is a 44 y.o. male presenting with abdominal pain.  Abdominal Pain Associated symptoms: no chest pain, no chills, no cough, no diarrhea, no fatigue, no fever, no hematuria, no nausea, no shortness of breath and no vomiting     Past Medical History  Diagnosis Date  . Diabetes mellitus without complication   . Thyroid disease    Past Surgical History  Procedure Laterality Date  . Hematoma evacuation      on L leg   Family History  Problem Relation Age of Onset  . Diabetes Mother   . Diabetes Maternal Grandmother    History  Substance Use Topics  . Smoking status: Never Smoker   . Smokeless tobacco: Never Used  . Alcohol Use: No    Review of Systems  Constitutional: Negative for fever, chills, diaphoresis and fatigue.  HENT: Negative for congestion, rhinorrhea and sneezing.   Eyes: Negative.   Respiratory: Negative for cough, chest tightness and shortness of breath.   Cardiovascular: Negative for chest pain and leg swelling.  Gastrointestinal: Positive for abdominal pain. Negative for nausea, vomiting, diarrhea and  blood in stool.  Genitourinary: Negative for frequency, hematuria, flank pain and difficulty urinating.  Musculoskeletal: Negative for back pain and arthralgias.  Skin: Negative for rash.  Neurological: Negative for dizziness, speech difficulty, weakness, numbness and headaches.      Allergies  Review of patient's allergies indicates no known allergies.  Home Medications   Prior to Admission medications   Medication Sig Start Date End Date Taking? Authorizing Provider  ibuprofen (ADVIL,MOTRIN) 200 MG tablet Take 200 mg by mouth every 6 (six) hours as needed for moderate pain.   Yes Historical Provider, MD  insulin aspart (NOVOLOG) 100 UNIT/ML injection Inject into the skin 3 (three) times daily before meals. 1 unit for every 10 carbs consumed   Yes Historical Provider, MD  insulin glargine (LANTUS) 100 UNIT/ML injection Inject 20 Units into the skin at bedtime.   Yes Historical Provider, MD  levothyroxine (SYNTHROID, LEVOTHROID) 150 MCG tablet Take 150 mcg by mouth daily before breakfast.   Yes Historical Provider, MD  loperamide (IMODIUM A-D) 2 MG tablet Take 2 mg by mouth 4 (four) times daily as needed for diarrhea or loose stools.   Yes Historical Provider, MD  omeprazole (PRILOSEC) 20 MG capsule Take 1 capsule (20 mg total) by mouth 2 (two) times daily before a meal. 01/26/15   Rolan Bucco, MD  sucralfate (CARAFATE) 1 G tablet Take 1 tablet (1 g total) by mouth 4 (four) times daily -  with meals and at bedtime. 01/26/15   Rolan Bucco, MD   BP 125/61 mmHg  Pulse  62  Temp(Src) 98.2 F (36.8 C) (Oral)  Resp 16  Wt 205 lb (92.987 kg)  SpO2 99% Physical Exam  Constitutional: He is oriented to person, place, and time. He appears well-developed and well-nourished.  HENT:  Head: Normocephalic and atraumatic.  Eyes: Pupils are equal, round, and reactive to light.  Neck: Normal range of motion. Neck supple.  Cardiovascular: Normal rate, regular rhythm and normal heart sounds.    Pulmonary/Chest: Effort normal and breath sounds normal. No respiratory distress. He has no wheezes. He has no rales. He exhibits no tenderness.  Abdominal: Soft. Bowel sounds are normal. There is tenderness (Positive tenderness to epigastrium). There is no rebound and no guarding.  Musculoskeletal: Normal range of motion. He exhibits no edema.  Lymphadenopathy:    He has no cervical adenopathy.  Neurological: He is alert and oriented to person, place, and time.  Skin: Skin is warm and dry. No rash noted.  Psychiatric: He has a normal mood and affect.    ED Course  Procedures (including critical care time) Labs Review Labs Reviewed  CBC WITH DIFFERENTIAL/PLATELET - Abnormal; Notable for the following:    Neutrophils Relative % 79 (*)    All other components within normal limits  COMPREHENSIVE METABOLIC PANEL - Abnormal; Notable for the following:    Glucose, Bld 257 (*)    GFR calc non Af Amer 84 (*)    All other components within normal limits  URINALYSIS, ROUTINE W REFLEX MICROSCOPIC - Abnormal; Notable for the following:    Specific Gravity, Urine 1.045 (*)    Glucose, UA >1000 (*)    All other components within normal limits  LIPASE, BLOOD  URINE MICROSCOPIC-ADD ON    Imaging Review [place] Abdomen Limited  01/26/2015   CLINICAL DATA:  Abdominal pain.  EXAM: US ABDOMEN LIMITED - RIGHT UPPER QUADRANT  COMPARISON:  None.  FINDINGS: Gallbladder:  No gallstones or wall thickening visualized. No sonographic Murphy sign noted.  Common bile duct:  Diameter: 3.4 mm  Liver:  No focal lesion identified. Within normal limits in parenchymal echogenicity.  IMPRESSION: No acute or focal abnormality.   Electronically Signed   By: Maisie Fushomas  Register   On: 01/26/2015 13:20     EKG Interpretation None      MDM   Final diagnoses:  Gastritis    Patient's pain is improved. He has no evidence of gallbladder disease. No evidence of pancreatitis. He likely has a gastritis. He was given a GI  cocktail and Protonix and the ED. He was discharged home in good condition. He has no suggestions of a bowel obstruction or perforation. He's well appearing. He was given prescription for Prilosec and Carafate. He was advised to follow-up with his primary care physician or gastroenterology if his symptoms aren't improving. He was given referral to a gastroenterologist. He was ice return here if his symptoms worsen. He was also advised to avoid acid containing foods as well as caffeine and alcohol and eat a bland diet.    Rolan BuccoMelanie Nygel Prokop, MD 01/26/15 [phone]

## 2015-01-26 NOTE — Discharge Instructions (Signed)

## 2015-01-26 NOTE — ED Notes (Signed)
Patient transported to Ultrasound 

## 2015-01-26 NOTE — ED Notes (Signed)
Pt reports generalized abd pain x2 days. Pt denies N/V/D. Pt took tums last night with no relief. Pt alert x4. NAD at this time.

## 2016-12-27 DIAGNOSIS — E161 Other hypoglycemia: Secondary | ICD-10-CM | POA: Diagnosis not present

## 2016-12-27 DIAGNOSIS — R7309 Other abnormal glucose: Secondary | ICD-10-CM | POA: Diagnosis not present

## 2017-01-04 DIAGNOSIS — R739 Hyperglycemia, unspecified: Secondary | ICD-10-CM | POA: Diagnosis not present

## 2017-01-04 DIAGNOSIS — E161 Other hypoglycemia: Secondary | ICD-10-CM | POA: Diagnosis not present

## 2017-01-21 DIAGNOSIS — E161 Other hypoglycemia: Secondary | ICD-10-CM | POA: Diagnosis not present

## 2017-01-21 DIAGNOSIS — R404 Transient alteration of awareness: Secondary | ICD-10-CM | POA: Diagnosis not present

## 2017-01-21 DIAGNOSIS — E162 Hypoglycemia, unspecified: Secondary | ICD-10-CM | POA: Diagnosis not present

## 2017-04-02 ENCOUNTER — Emergency Department (HOSPITAL_COMMUNITY)
Admission: EM | Admit: 2017-04-02 | Discharge: 2017-04-02 | Disposition: A | Discharge status: Home / Self Care | Payer: BLUE CROSS/BLUE SHIELD | Attending: Emergency Medicine | Admitting: Emergency Medicine

## 2017-04-02 ENCOUNTER — Encounter (HOSPITAL_COMMUNITY): Payer: Self-pay | Admitting: Obstetrics and Gynecology

## 2017-04-02 DIAGNOSIS — Z794 Long term (current) use of insulin: Secondary | ICD-10-CM | POA: Diagnosis not present

## 2017-04-02 DIAGNOSIS — E11649 Type 2 diabetes mellitus with hypoglycemia without coma: Secondary | ICD-10-CM | POA: Diagnosis not present

## 2017-04-02 DIAGNOSIS — E162 Hypoglycemia, unspecified: Secondary | ICD-10-CM

## 2017-04-02 DIAGNOSIS — E119 Type 2 diabetes mellitus without complications: Secondary | ICD-10-CM | POA: Diagnosis not present

## 2017-04-02 DIAGNOSIS — E161 Other hypoglycemia: Secondary | ICD-10-CM | POA: Diagnosis not present

## 2017-04-02 LAB — CBG MONITORING, ED
GLUCOSE-CAPILLARY: 176 mg/dL — AB (ref 65–99)
GLUCOSE-CAPILLARY: 236 mg/dL — AB (ref 65–99)

## 2017-04-02 NOTE — ED Notes (Signed)
Bed: [id number] Expected date:  Expected time:  Means of arrival:  Comments: EMS  Hypoglycemia

## 2017-04-02 NOTE — Discharge Instructions (Signed)
Be sure to eat regular meals when you are taking your insulin. Follow-up with your primary care doctor and/or endocrinologist. You may return to the emergency department for new or concerning symptoms.

## 2017-04-02 NOTE — ED Provider Notes (Signed)
[facility]-EMERGENCY DEPT Provider Note   CSN: [id number] Arrival date & time: 04/02/17  2116     History   Chief Complaint Chief Complaint  Patient presents with  . Hypoglycemia    HPI [name] is a 46 y.o. male.  46 year old male with history of insulin-dependent diabetes as well as thyroid disease presents to the emergency department for hypoglycemia. Patient was taking the bus home from work when he was found to slowly become less responsive. Bystander called 911. EMS responded and noted CBG of 24. Patient received 1 mg of glucagon in the right shoulder by EMS. This improved the patient's sugar to 80 as well as improving his mental status. Patient has no complaints at this time. He denies pain as well as recent fever or viral illness. He has not had any changes to his insulin regimen in the last 5-6 years. He notes taking his insulin at proximally 1500, but only having half of his usual lunch 30 minutes later. He states that he was on his way home to get something to eat. Patient denies nausea, vomiting, chest pain, abdominal pain. He is followed by an endocrinologist in [place] with plans to follow-up within the month.      Past Medical History:  Diagnosis Date  . Diabetes mellitus without complication (HCC)   . Thyroid disease     There are no active problems to display for this patient.   Past Surgical History:  Procedure Laterality Date  . HEMATOMA EVACUATION     on L leg       Home Medications    Prior to Admission medications   Medication Sig Start Date End Date Taking? Authorizing Provider  insulin aspart (NOVOLOG) 100 UNIT/ML injection Inject into the skin 3 (three) times daily before meals. 1 unit for every 10 carbs consumed   Yes [provider]  insulin glargine (LANTUS) 100 UNIT/ML injection Inject 20 Units into the skin at bedtime.   Yes [provider]  levothyroxine (SYNTHROID, LEVOTHROID) 175 MCG tablet Take 175 mcg by  mouth daily before breakfast.    Yes [provider]    Family History Family History  Problem Relation Age of Onset  . Diabetes Mother   . Diabetes Maternal Grandmother     Social History Social History  Substance Use Topics  . Smoking status: Never Smoker  . Smokeless tobacco: Never Used  . Alcohol use No     Allergies   Patient has no known allergies.   Review of Systems Review of Systems Ten systems reviewed and are negative for acute change, except as noted in the HPI.    Physical Exam Updated Vital Signs BP 129/86   Pulse 74   Temp 98.2 F (36.8 C) (Oral)   Resp 16   SpO2 100%   Physical Exam  Constitutional: He is oriented to person, place, and time. He appears well-developed and well-nourished. No distress.  Nontoxic and in NAD  HENT:  Head: Normocephalic and atraumatic.  Eyes: Conjunctivae and EOM are normal. No scleral icterus.  Neck: Normal range of motion.  Cardiovascular: Normal rate, regular rhythm and intact distal pulses.   Pulmonary/Chest: Effort normal. No respiratory distress. He has no wheezes. He has no rales.  Lungs CTAB. Respirations even, unlabored.  Musculoskeletal: Normal range of motion.  Neurological: He is alert and oriented to person, place, and time. He exhibits normal muscle tone. Coordination normal.  GCS 15. Speech is goal oriented. Patient moving all extremities.  Skin: Skin is warm and dry. No rash noted. He is not diaphoretic. No erythema. No pallor.  Psychiatric: He has a normal mood and affect. His behavior is normal.  Nursing note and vitals reviewed.    ED Treatments / Results  Labs (all labs ordered are listed, but only abnormal results are displayed) Labs Reviewed  CBG MONITORING, ED - Abnormal; Notable for the following:       Result Value   Glucose-Capillary 176 (*)    All other components within normal limits  CBG MONITORING, ED - Abnormal; Notable for the following:    Glucose-Capillary 236 (*)      All other components within normal limits    EKG  EKG Interpretation None       Radiology No results found.  Procedures Procedures (including critical care time)  Medications Ordered in ED Medications - No data to display   Initial Impression / Assessment and Plan / ED Course  I have reviewed the triage vital signs and the nursing notes.  Pertinent labs & imaging results that were available during my care of the patient were reviewed by me and considered in my medical decision making (see chart for details).     46 year old male present to the emergency department for hypoglycemia. He initially had a CBG of 24. This improved to a CBG of 80 after 1 mg of IM glucagon given by EMS. Patient has been given crackers with peanut butter. He is mentating at baseline with no complaints. He denies recent illness or changes to his insulin regimen. Suspect that hypoglycemia is due to insulin administration and under eating. Patient has been monitored in the emergency department for over an hour without decompensation. He is requesting discharge. I do not see indication for further emergent workup or monitoring. Vitals stable. Patient discharged in satisfactory condition with no unaddressed concerns.   Final Clinical Impressions(s) / ED Diagnoses   Final diagnoses:  Hypoglycemia    New Prescriptions Discharge Medication List as of 04/02/2017 10:36 PM       Antony MaduraHumes, Azahel Belcastro, PA-C 04/02/17 2314    Lorre NickAllen, Anthony, MD 04/02/17 2321

## 2017-04-02 NOTE — ED Triage Notes (Signed)
Per EMS:  Pt has a hx of diabetes. Pt was sitting on the bus and a bystander saw him going unresponsive and called 911. Pt's initial CBG was 24 Pt received 1mg  of glucagon in the right shoulder. Most recent CBG was 80. Pt is currently alert and oriented.  143/89 HR 80 SPO2 98 on RA RR 18

## 2017-07-01 DIAGNOSIS — E161 Other hypoglycemia: Secondary | ICD-10-CM | POA: Diagnosis not present

## 2017-07-01 DIAGNOSIS — E162 Hypoglycemia, unspecified: Secondary | ICD-10-CM | POA: Diagnosis not present

## 2017-08-30 DIAGNOSIS — E162 Hypoglycemia, unspecified: Secondary | ICD-10-CM | POA: Diagnosis not present

## 2017-08-30 DIAGNOSIS — E161 Other hypoglycemia: Secondary | ICD-10-CM | POA: Diagnosis not present

## 2017-11-22 DIAGNOSIS — E161 Other hypoglycemia: Secondary | ICD-10-CM | POA: Diagnosis not present

## 2017-12-09 DIAGNOSIS — R531 Weakness: Secondary | ICD-10-CM | POA: Diagnosis not present

## 2018-01-03 DIAGNOSIS — E162 Hypoglycemia, unspecified: Secondary | ICD-10-CM | POA: Diagnosis not present

## 2018-01-03 DIAGNOSIS — E161 Other hypoglycemia: Secondary | ICD-10-CM | POA: Diagnosis not present

## 2018-01-12 DIAGNOSIS — E161 Other hypoglycemia: Secondary | ICD-10-CM | POA: Diagnosis not present

## 2018-01-12 DIAGNOSIS — E162 Hypoglycemia, unspecified: Secondary | ICD-10-CM | POA: Diagnosis not present

## 2018-01-22 DIAGNOSIS — E161 Other hypoglycemia: Secondary | ICD-10-CM | POA: Diagnosis not present

## 2018-01-22 DIAGNOSIS — E162 Hypoglycemia, unspecified: Secondary | ICD-10-CM | POA: Diagnosis not present

## 2018-01-23 DIAGNOSIS — E162 Hypoglycemia, unspecified: Secondary | ICD-10-CM | POA: Diagnosis not present

## 2018-02-10 DIAGNOSIS — E161 Other hypoglycemia: Secondary | ICD-10-CM | POA: Diagnosis not present

## 2018-02-10 DIAGNOSIS — E162 Hypoglycemia, unspecified: Secondary | ICD-10-CM | POA: Diagnosis not present

## 2018-02-13 DIAGNOSIS — E161 Other hypoglycemia: Secondary | ICD-10-CM | POA: Diagnosis not present

## 2018-02-16 DIAGNOSIS — E161 Other hypoglycemia: Secondary | ICD-10-CM | POA: Diagnosis not present

## 2018-04-20 DIAGNOSIS — E119 Type 2 diabetes mellitus without complications: Secondary | ICD-10-CM | POA: Diagnosis not present

## 2018-04-20 DIAGNOSIS — E039 Hypothyroidism, unspecified: Secondary | ICD-10-CM | POA: Diagnosis not present

## 2018-04-20 DIAGNOSIS — Z0000 Encounter for general adult medical examination without abnormal findings: Secondary | ICD-10-CM | POA: Diagnosis not present

## 2018-04-20 DIAGNOSIS — E109 Type 1 diabetes mellitus without complications: Secondary | ICD-10-CM | POA: Diagnosis not present

## 2018-04-20 DIAGNOSIS — Z794 Long term (current) use of insulin: Secondary | ICD-10-CM | POA: Diagnosis not present

## 2018-04-20 DIAGNOSIS — M25512 Pain in left shoulder: Secondary | ICD-10-CM | POA: Diagnosis not present

## 2018-10-26 ENCOUNTER — Ambulatory Visit: Payer: BLUE CROSS/BLUE SHIELD | Admitting: Internal Medicine

## 2018-11-11 ENCOUNTER — Encounter: Payer: Self-pay | Admitting: Internal Medicine

## 2018-11-11 ENCOUNTER — Ambulatory Visit (INDEPENDENT_AMBULATORY_CARE_PROVIDER_SITE_OTHER): Payer: BLUE CROSS/BLUE SHIELD | Admitting: Internal Medicine

## 2018-11-11 VITALS — BP 122/84 | HR 64 | Ht 72.5 in | Wt 218.0 lb

## 2018-11-11 DIAGNOSIS — E1065 Type 1 diabetes mellitus with hyperglycemia: Secondary | ICD-10-CM | POA: Diagnosis not present

## 2018-11-11 DIAGNOSIS — E1369 Other specified diabetes mellitus with other specified complication: Secondary | ICD-10-CM

## 2018-11-11 LAB — POCT GLYCOSYLATED HEMOGLOBIN (HGB A1C): HEMOGLOBIN A1C: 8 % — AB (ref 4.0–5.6)

## 2018-11-11 LAB — BASIC METABOLIC PANEL
BUN: 18 mg/dL (ref 6–23)
CO2: 26 mEq/L (ref 19–32)
Calcium: 9.4 mg/dL (ref 8.4–10.5)
Chloride: 103 mEq/L (ref 96–112)
Creatinine, Ser: 1.09 mg/dL (ref 0.40–1.50)
GFR: 88.1 mL/min (ref >60.00)
GLUCOSE: 199 mg/dL — AB (ref 70–99)
POTASSIUM: 4.3 meq/L (ref 3.5–5.1)
SODIUM: 138 meq/L (ref 135–145)

## 2018-11-11 LAB — MICROALBUMIN / CREATININE URINE RATIO
Creatinine,U: 377.2 mg/dL
MICROALB UR: 2.3 mg/dL — AB (ref 0.0–1.9)
Microalb Creat Ratio: 0.6 mg/g (ref 0.0–30.0)

## 2018-11-11 NOTE — Progress Notes (Signed)
Name: [name]  MRN/ DOB: [id number], 1970/12/27   Age/ Sex: 48 y.o., male    PCP: Smithson, Letta Moynahan, MD   Reason for Endocrinology Evaluation: Type 1 Diabetes Mellitus     Date of Initial Endocrinology Visit: 11/12/2018     PATIENT IDENTIFIER: [name] is a 48 y.o. male with a past medical history of Hypothyroidism and T1DM. The patient presented for initial endocrinology clinic visit on 11/12/2018 for consultative assistance with his diabetes management.    HPI: Mr. Hawley was    Diagnosed with T1DM at age 19 Prior Medications tried/Intolerance: n/a Currently checking blood sugars 2-3x / day,  Before meals  Hypoglycemia episodes : yes            Symptoms:  Tired, shaky      Frequency: 4/ week Hemoglobin A1c has ranged from 7.3% in 2016, peaking at 8.3% in 2013. Patient required assistance for hypoglycemia: 8 months ago  Patient has required hospitalization within the last 1 year from hyper or hypoglycemia: no   In terms of diet, the patient snacks occasional , and he boluses for carbs.  Drinks lemonade.  He has not been driving for ~ 7 yrs because he skipped his medical exam DMV.      Thyroid Disease: Diagnosed with hyperthyroidism ~ 2011. S/P RAI radiation followed by Hypothyroidism shortly after the ablation. He has been on LT-4 replacement since.      HOME DIABETES REGIMEN: Lantus 20 units QHS Humalog I:C ratio  1:10 TID  Correctional scale   Statin: no ACE-I/ARB: no Prior Diabetic Education: yes   METER DOWNLOAD SUMMARY: Date range evaluated: 1/13-2/11/20 Fingerstick Blood Glucose Tests = 30 Overall Mean FS Glucose = 183.3 Standard Deviation = 102.1  BG Ranges: Low = 45 High = 412   Hypoglycemic Events/30 Days: BG < 50 = 2 Episodes of symptomatic severe hypoglycemia =0   DIABETIC COMPLICATIONS: Microvascular complications:    Denies: retinopathy, CKD , neuropathy   Last eye exam: Completed  03/2018  Macrovascular complications:    Denies: CAD, PVD, CVA   PAST HISTORY: Past Medical History:  Past Medical History:  Diagnosis Date  . Diabetes mellitus without complication (HCC)   . Thyroid disease    Past Surgical History:  Past Surgical History:  Procedure Laterality Date  . HEMATOMA EVACUATION     on L leg      Social History:  reports that he has never smoked. He has never used smokeless tobacco. He reports that he does not drink alcohol or use drugs. Family History:  Family History  Problem Relation Age of Onset  . Diabetes Mother   . Diabetes Maternal Grandmother      HOME MEDICATIONS: Allergies as of 11/11/2018   No Known Allergies     Medication List       Accurate as of November 11, 2018 11:59 PM. Always use your most recent med list.        insulin aspart 100 UNIT/ML injection Commonly known as:  novoLOG Inject into the skin 3 (three) times daily before meals. 1 unit for every 10 carbs consumed   insulin glargine 100 UNIT/ML injection Commonly known as:  LANTUS Inject 20 Units into the skin at bedtime.   levothyroxine 175 MCG tablet Commonly known as:  SYNTHROID, LEVOTHROID Take 175 mcg by mouth daily before breakfast.        ALLERGIES: No Known Allergies   REVIEW OF SYSTEMS: A comprehensive ROS was conducted with  the patient and is negative except as per HPI and below:  Review of Systems  HENT: Negative for congestion and sore throat.   Respiratory: Negative for cough and shortness of breath.   Cardiovascular: Negative for chest pain and palpitations.  Gastrointestinal: Negative for diarrhea and nausea.  Genitourinary: Negative for frequency.  Musculoskeletal: Negative for joint pain.  Neurological: Negative for tingling and tremors.  Endo/Heme/Allergies: Negative for polydipsia.  Psychiatric/Behavioral: Negative for depression. The patient is not nervous/anxious.       OBJECTIVE:   VITAL SIGNS: BP 122/84   Pulse 64    Ht 6' 0.5" (1.842 m)   Wt 218 lb (98.9 kg)   SpO2 98%   BMI 29.16 kg/m    PHYSICAL EXAM:  General: Pt appears well and is in NAD  Hydration: Well-hydrated with moist mucous membranes and good skin turgor  HEENT: Head: Unremarkable with good dentition. Oropharynx clear without exudate.  Eyes: External eye exam normal without stare, lid lag or exophthalmos.  EOM intact.  PERRL.  Neck: General: Supple without adenopathy or carotid bruits. Thyroid: Thyroid size normal.  No goiter or nodules appreciated. No thyroid bruit.  Lungs: Clear with good BS bilat with no rales, rhonchi, or wheezes  Heart: RRR with normal S1 and S2 and no gallops; no murmurs; no rub  Abdomen: Normoactive bowel sounds, soft, nontender, without masses or organomegaly palpable  Extremities:  Lower extremities - No pretibial edema. No lesions.  Skin: Normal texture and temperature to palpation.   Neuro: MS is good with appropriate affect, pt is alert and Ox3    DM foot exam: 11/11/18 The skin of the feet is intact without sores or ulcerations. Nails are dystrophic  The pedal pulses are 2+ on right and 2+ on left. The sensation is intact to a screening 5.07, 10 gram monofilament bilaterally   DATA REVIEWED: 04/20/18  A1c 8.0 %      BUN/Cr 16/1.08 mg/dL  GFR 82 WU/JWJ/1.91 m2 LDL 138.5 mg/dL  HDL 52 mg/dL T.Cholesterol    230  Old records , labs and images have been reviewed.   Results for TYRE, COADY (MRN [id number]) as of 11/12/2018 09:10  Ref. Range 11/11/2018 09:41  BASIC METABOLIC PANEL Unknown Rpt (A)  Sodium Latest Ref Range: 135 - 145 mEq/L 138  Potassium Latest Ref Range: 3.5 - 5.1 mEq/L 4.3  Chloride Latest Ref Range: 96 - 112 mEq/L 103  CO2 Latest Ref Range: 19 - 32 mEq/L 26  Glucose Latest Ref Range: 70 - 99 mg/dL 308 (H)  BUN Latest Ref Range: 6 - 23 mg/dL 18  Creatinine Latest Ref Range: 0.40 - 1.50 mg/dL 6.57  Calcium Latest Ref Range: 8.4 - 10.5 mg/dL 9.4  GFR Latest Ref Range:  >60.00 mL/min 88.10  MICROALB/CREAT RATIO Latest Ref Range: 0.0 - 30.0 mg/g 0.6   ASSESSMENT / PLAN / RECOMMENDATIONS:   1) Type 1 Diabetes Mellitus, Sub-Optimally controlled, Without complications - Most recent A1c of 8.0 %. Goal A1c < 7.0 %.    Plan: GENERAL: Discussed pharmacokinetics of basal/bolus insulin and the importance of taking prandial insulin with meals.   We also discussed avoiding sugar-sweetened beverages and snacks, when possible.   In review of his meter download, patient has fluctuating BG's ranging from 300s and sometimes 400 mg/dL to as low as 45 mg/dL, most of his hypoglycemic episodes occur during the day specifically at night, patient typically takes his prandial insulin before he eats but then he checks his sugars with  an hour or 2 postprandial and if it is high he boluses someone,  Patient advised to stop bolusing for postprandial glucose because this is what increases the risk for hypoglycemia  I will not be making any changes today except with calculating his sensitivity factor and giving him a new correction scale as below  He is interested in insulin pump technology, will refer him to our CDE for further discussion  He uses 2 different meters one for home and one for work hence low BG count on his meter today, he was provided with glucose logs so he can write all his readings on one paper.  MEDICATIONS:  Continue Lantus at 20 units daily  - Continue Humalog one unit for every 10 grams of carbohydrates - Humalog correctional insulin: ADD extra units on insulin to your meal-time Humalog dose if your blood sugars are higher than 195. Use the scale below to help guide you:   Blood sugar before meal Number of units to inject  Less than 195 0 unit  196 -  240 1 units  241 -  285 2 units  286 -  330 3 units  331 -  375 4 units  376 -  420 5 units  421 -  465 6 units  466 -  510 7 units  511 - 555 8 units     EDUCATION / INSTRUCTIONS:  BG monitoring  instructions: Patient is instructed to check his blood sugars 4 times a day, before meals and bedtime.  Call [facility] if: BG persistently < 70 or > 300. . I reviewed the Rule of 15 for the treatment of hypoglycemia in detail with the patient. Literature supplied.   2) Diabetic complications:   Eye: Does not have known diabetic retinopathy.   Neuro/ Feet: Does not have known diabetic peripheral neuropathy.  Renal: Patient does not have known baseline CKD. He is not on an ACEI/ARB at present.  Repeat urine microalbumin levels today is normal as well as BMP.   3) Lipids: Patient is not on a statin. His 10- year ASCVD risk is 4.1% .  Due to his diabetes status I would recommend starting a moderate intensity statin such as atorvastatin 10 to 20 mg daily.  I will defer this to the discretion of the PCP.  4) Post-Ablative Hypothyroidism :  -Clinically he is euthyroid -This is managed by his PCP  Follow-up in 3 weeks    Signed electronically by: Lyndle Herrlich, MD  [facility] Endocrinology  [facility] [address] [place] [place], [place] [address] Phone: [phone] FAX: [phone]   CC: Wonda Olds, MD [address] [place] [address] Phone: [phone]  Fax: [phone]    Return to Endocrinology clinic as below: Future Appointments  Date Time Provider Department Center  11/17/2018  3:30 PM Jessica Priest, RN [facility]  12/09/2018  8:30 AM Amani Nodarse, Konrad Dolores, MD [facility] None

## 2018-11-11 NOTE — Patient Instructions (Addendum)
-   Continue Lantus at 20 units daily  - Continue Humalog one unit for every 10 grams of carbohydrates - Humalog correctional insulin: ADD extra units on insulin to your meal-time Humalog dose if your blood sugars are higher than 195. Use the scale below to help guide you:   Blood sugar before meal Number of units to inject  Less than 195 0 unit  196 -  240 1 units  241 -  285 2 units  286 -  330 3 units  331 -  375 4 units  376 -  420 5 units  421 -  465 6 units  466 -  510 7 units  511 - 555 8 units    Choose healthy, lower carb lower calorie snacks: toss salad, cooked vegetables, cottage cheese, peanut butter, low fat cheese / string cheese, lower sodium deli meat, tuna salad or chicken salad     HOW TO TREAT LOW BLOOD SUGARS (Blood sugar LESS THAN 70 MG/DL)  Please follow the RULE OF 15 for the treatment of hypoglycemia treatment (when your (blood sugars are less than 70 mg/dL)    STEP 1: Take 15 grams of carbohydrates when your blood sugar is low, which includes:   3-4 GLUCOSE TABS  OR  3-4 OZ OF JUICE OR REGULAR SODA OR  ONE TUBE OF GLUCOSE GEL     STEP 2: RECHECK blood sugar in 15 MINUTES STEP 3: If your blood sugar is still low at the 15 minute recheck --> then, go back to STEP 1 and treat AGAIN with another 15 grams of carbohydrates.

## 2018-11-12 ENCOUNTER — Encounter: Payer: Self-pay | Admitting: Internal Medicine

## 2018-11-17 ENCOUNTER — Encounter: Payer: BLUE CROSS/BLUE SHIELD | Attending: Nutrition | Admitting: Nutrition

## 2018-11-24 ENCOUNTER — Encounter: Payer: BLUE CROSS/BLUE SHIELD | Admitting: Nutrition

## 2018-12-01 ENCOUNTER — Encounter: Payer: BLUE CROSS/BLUE SHIELD | Attending: Internal Medicine | Admitting: Nutrition

## 2018-12-01 ENCOUNTER — Other Ambulatory Visit: Payer: Self-pay

## 2018-12-01 DIAGNOSIS — E1065 Type 1 diabetes mellitus with hyperglycemia: Secondary | ICD-10-CM

## 2018-12-01 NOTE — Progress Notes (Signed)
We discussed how pumps work, and he was shown the 3 insulin pumps.  We discussed the advantages and disadvantages of each model, and their respective CGMS.  He was given brochures of each model, and told to go on line to learn more and decide which model you want.  He had no final questions.

## 2018-12-01 NOTE — Patient Instructions (Signed)
Read over information given and go on line for more information. Once you decide which pump you want, fill out paperwork and fax to respective number.  Call representative on front of brochure if more questions.

## 2018-12-08 ENCOUNTER — Telehealth: Payer: Self-pay | Admitting: Nutrition

## 2018-12-08 NOTE — Telephone Encounter (Signed)
This patient had told the dietitian that, after talking with me, he had decided on the [employer] insulin pump.  Paperwork was filled out and faxed to [employer] and patient was notified that this was done, and to expect a call from them.  He had no questions for me.

## 2018-12-09 ENCOUNTER — Other Ambulatory Visit: Payer: Self-pay

## 2018-12-09 ENCOUNTER — Ambulatory Visit (INDEPENDENT_AMBULATORY_CARE_PROVIDER_SITE_OTHER): Payer: BLUE CROSS/BLUE SHIELD | Admitting: Internal Medicine

## 2018-12-09 ENCOUNTER — Encounter: Payer: Self-pay | Admitting: Internal Medicine

## 2018-12-09 VITALS — BP 136/86 | HR 77 | Ht 73.0 in | Wt 227.0 lb

## 2018-12-09 DIAGNOSIS — E1065 Type 1 diabetes mellitus with hyperglycemia: Secondary | ICD-10-CM | POA: Diagnosis not present

## 2018-12-09 NOTE — Patient Instructions (Signed)
-   Continue Lantus at 20 units daily  - Continue Humalog one unit for every 10 grams of carbohydrates - For snack take 1 unit of humalog for every 15 grams of carbohydrate   - Humalog correctional insulin: ADD extra units on insulin to your meal-time Humalog dose if your blood sugars are higher than 195. Use the scale below to help guide you:   Blood sugar before meal Number of units to inject  Less than 195 0 unit  196 -  240 1 units  241 -  285 2 units  286 -  330 3 units  331 -  375 4 units  376 -  420 5 units  421 -  465 6 units  466 -  510 7 units  511 - 555 8 units    Choose healthy, lower carb lower calorie snacks: toss salad, cooked vegetables, cottage cheese, peanut butter, low fat cheese / string cheese, lower sodium deli meat, tuna salad or chicken salad     HOW TO TREAT LOW BLOOD SUGARS (Blood sugar LESS THAN 70 MG/DL)  Please follow the RULE OF 15 for the treatment of hypoglycemia treatment (when your (blood sugars are less than 70 mg/dL)    STEP 1: Take 15 grams of carbohydrates when your blood sugar is low, which includes:   3-4 GLUCOSE TABS  OR  3-4 OZ OF JUICE OR REGULAR SODA OR  ONE TUBE OF GLUCOSE GEL     STEP 2: RECHECK blood sugar in 15 MINUTES STEP 3: If your blood sugar is still low at the 15 minute recheck --> then, go back to STEP 1 and treat AGAIN with another 15 grams of carbohydrates.

## 2018-12-09 NOTE — Progress Notes (Signed)
Name: MALIQ INCLAN  Age/ Sex: 48 y.o., male   MRN/ DOB: [id number], Jul 29, 1971      PCP: Wonda Olds, MD   Reason for Endocrinology Evaluation: Type 1 diabetes Mellitus  Initial Endocrine Consultative Visit:  11/11/2018    PATIENT IDENTIFIER: Mr. DEYVI FICCO is a 48 y.o. male with a past medical history of Hypothyroidism and T1DM. The patient has followed with Endocrinology clinic since 11/11/2018 for consultative assistance with management of his diabetes.  DIABETIC HISTORY:  Mr. Laskin was diagnosed with T1DM at the age of 71, he has been on insulin since his diagnosis. His hemoglobin A1c has ranged from 7.3% in 2016, peaking at 8.3% in 2013.  He has not driven in approximately 7 years as he failed to keep up with his medical exams to the [facility].  SUBJECTIVE:   During the last visit (11/11/2018): His A1c was 8.0%.  On his meter download he was noted to have fluctuating BG's ranging between 45 mg/dL up to 751 mg/dL.,  Patient was taking his prandial before he eats but he recheck his glucose 1 or 2 hours postprandial and will bolus for those high sugar readings, which was causing hypoglycemia.  No changes were made to his insulin regimen at the time  Today (12/09/2018): Mr. Mccrea is here for 4-week follow-up on his diabetes management. He checks his blood sugars 4  times daily, preprandial . The patient has  had hypoglycemic episodes since the last clinic visit, which typically occur a few  x /week - most often occuring during the day.The patient is symptomatic with these episodes. Otherwise, the patient has not required any recent emergency interventions for hypoglycemia and has not had recent hospitalizations secondary to hyper or hypoglycemic episodes.    ROS: As per HPI and as detailed below: Review of Systems  Constitutional: Negative for weight loss.  HENT: Negative for  congestion and sore throat.   Respiratory: Negative for cough and shortness of breath.   Cardiovascular: Negative for chest pain and palpitations.  Gastrointestinal: Negative for diarrhea and nausea.      HOME DIABETES REGIMEN:  Lantus at 20 units daily  Humalog one unit for every 10 grams of carbohydrates- sometimes he does 1:8  Humalog correctional insulin: Not using  Blood sugar before meal Number of units to inject  Less than 195 0 unit  196 -  240 1 units  241 -  285 2 units  286 -  330 3 units  331 -  375 4 units  376 -  420 5 units  421 -  465 6 units  466 -  510 7 units  511 - 555 8 units    GLUCOSE LOG:        HISTORY:  Past Medical History:  Past Medical History:  Diagnosis Date  . Diabetes mellitus without complication (HCC)   . Thyroid disease    Past Surgical History:  Past Surgical History:  Procedure Laterality Date  . HEMATOMA EVACUATION     on L leg    Social History:  reports that he has never smoked. He has never used smokeless tobacco. He reports that he does not drink alcohol or use drugs. Family History:  Family History  Problem Relation Age of Onset  . Diabetes Mother   . Diabetes Maternal Grandmother      HOME MEDICATIONS: Allergies as of 12/09/2018   No Known Allergies     Medication List  Accurate as of December 09, 2018 10:21 AM. Always use your most recent med list.        insulin aspart 100 UNIT/ML injection Commonly known as:  novoLOG Inject into the skin 3 (three) times daily before meals. 1 unit for every 10 carbs consumed   insulin glargine 100 UNIT/ML injection Commonly known as:  LANTUS Inject 20 Units into the skin at bedtime.   levothyroxine 175 MCG tablet Commonly known as:  SYNTHROID, LEVOTHROID Take 175 mcg by mouth daily before breakfast.        OBJECTIVE:   Vital Signs: BP 136/86 (BP Location: Left Arm, Patient Position: Sitting, Cuff Size: Normal)   Pulse 77   Ht  (1.854 m)   Wt 227  lb (103 kg)   SpO2 97%   BMI 29.95 kg/m   Wt Readings from Last 3 Encounters:  12/09/18 227 lb (103 kg)  11/11/18 218 lb (98.9 kg)  01/26/15 205 lb (93 kg)     Exam: General: Pt appears well and is in NAD  Lungs: Clear with good BS bilat with no rales, rhonchi, or wheezes  Heart: RRR with normal S1 and S2 and no gallops; no murmurs; no rub  Abdomen: Normoactive bowel sounds, soft, nontender, without masses or organomegaly palpable  Extremities: No pretibial edema. No tremor. Normal strength and motion throughout. See detailed diabetic foot exam below.  Skin: Normal texture and temperature to palpation. No rash noted. No Acanthosis nigricans/skin tags. No lipohypertrophy.  Neuro: MS is good with appropriate affect, pt is alert and Ox3        DM foot exam: 11/11/18 The skin of the feet is intact without sores or ulcerations. Nails are dystrophic  The pedal pulses are 2+ on right and 2+ on left. The sensation is intact to a screening 5.07, 10 gram monofilament b    DATA REVIEWED:  Lab Results  Component Value Date   HGBA1C 8.0 (A) 11/11/2018   Lab Results  Component Value Date   MICROALBUR 2.3 (H) 11/11/2018   CREATININE 1.09 11/11/2018   Lab Results  Component Value Date   MICRALBCREAT 0.6 11/11/2018       04/20/18  A1c 8.0 %      BUN/Cr 16/1.08 mg/dL  GFR 82 ZO/XWR/6.04 m2 LDL 138.5 mg/dL  HDL 52 mg/dL T.Cholesterol    230   ASSESSMENT / PLAN / RECOMMENDATIONS:   1) Type 1 diabetes Mellitus, poorly controlled, With out complications - Most recent A1c of 8.0 %. Goal A1c <7.0 %.    Plan:  -Patient continues with dietary indiscretion, he continues to drink occasional sugar sweetened beverages, patient also snacks at bedtime with no prandial coverage. -I have praised him on his glucose checks, and writing  them down in the glucose log, but he continues with glucos excrusions s with BG's ranging from 53 to 400 mg/dL.  A lot of this is due to improper use of  prandial insulin. -Patient is not consistent with using the insulin to carb ratio of 1:10, I am not sure if this is because he is not very knowledgeable about carbohydrate counting, or he just does not trust the insulin to carb ratio of 1:10 -I have advised him that glucose excrusions are much more difficult to control and much more dangerous than having steady hyperglycemia.  I have asked him to remain consistent with his insulin to carb ratio regardless of what that number is, and if he notices that his sugars are out of control  then we can adjust this together. -At this time I am not able to adjust his insulin to carb ratio, as I am not sure which ratio he has been using with with each meal. -I have also advised him to avoid sugar sweetened beverages, I have encouraged him to use prandial insulin with snacks, and to use an insulin to carb ratio of 1-15 with snacks. -He had expressed interest in having an insulin pump, I have referred him to our CDE, Ms. Spagnolia, but he has not scheduled that appointment yet, patient tells me today that he is seeing somebody else from outside this practice, I am not sure how he has been able to get in touch with this person to discuss options for an insulin pump.   -Patient has not been using the correctional scale of Humalog that I have provided for him, patient has been using his previous endocrinologist correction scale, I have explained to him that his sugars are not well controlled and our priority is to eliminate hypoglycemic episodes, and it seems like a lot of his hypoglycemic episodes happen after giving himself a bolus and a correction, and that may be the correctional insulin that he has at home  is too tight for him. - I also have advised him that if he would like to continue doing his own thing, I am not sure I will have any beneficial role in getting his sugars under optima control. - We also discussed the importance of correcting hypoglycemia prior to  proceeding with eating and to consider taking 50% of his prandial dose rather then none, as that will result in hyperglycemia by the next meal.      MEDICATIONS:  Continue Lantus 20 units daily  Continue Humalog 1-10 carb ratio  Continue correctional Humalog Blood sugar before meal Number of units to inject  Less than 195 0 unit  196 -  240 1 units  241 -  285 2 units  286 -  330 3 units  331 -  375 4 units  376 -  420 5 units  421 -  465 6 units  466 -  510 7 units  511 - 555 8 units   EDUCATION / INSTRUCTIONS:  BG monitoring instructions: Patient is instructed to check his blood sugars 4 times a day, before meals and bedtime.  Call [facility] if: BG persistently < 70 or > 300. . I reviewed the Rule of 15 for the treatment of hypoglycemia in detail with the patient. Literature supplied.      F/U in 3 months   25 minutes was spent with the patient, over 50% of the time was spent in counseling and education    Signed electronically by: Lyndle Herrlich, MD  [facility] Endocrinology  [facility] [address] [place], [place] [address] Phone: [phone] FAX: [phone]   CC: Wonda Olds, MD [address] [place] [address] Phone: [phone]  Fax: [phone]  Return to Endocrinology clinic as below: No future appointments.

## 2019-01-19 ENCOUNTER — Telehealth: Payer: Self-pay | Admitting: Nutrition

## 2019-01-19 ENCOUNTER — Other Ambulatory Visit: Payer: Self-pay | Admitting: Internal Medicine

## 2019-01-19 MED ORDER — INSULIN LISPRO (1 UNIT DIAL) 100 UNIT/ML (KWIKPEN): 10.0000 [IU] | PEN_INJECTOR | Freq: Three times a day (TID) | SUBCUTANEOUS | 11 refills | Status: DC

## 2019-01-19 MED ORDER — INSULIN DEGLUDEC 100 UNIT/ML ~~LOC~~ SOPN: 20.0000 [IU] | PEN_INJECTOR | Freq: Every day | SUBCUTANEOUS | 12 refills | Status: DC

## 2019-01-19 NOTE — Telephone Encounter (Signed)
Pt informed

## 2019-01-19 NOTE — Telephone Encounter (Signed)
Patient called saying he was not able to get [employer] because his company filed for bankruptcy.  He no longer has insurance coverage, and he will not be getting his insulin pump.  He was told of the [employer] number to call to get insulin for $35.00/month.  He is on Humalog and Lantus.  I told him we can switch him to basaglar if ok with Dr. Lonzo Cloud.  He was very appreciative, and said he will call them.

## 2020-06-26 ENCOUNTER — Encounter (HOSPITAL_COMMUNITY): Payer: Self-pay | Admitting: Emergency Medicine

## 2020-06-26 ENCOUNTER — Emergency Department (HOSPITAL_COMMUNITY)
Admission: EM | Admit: 2020-06-26 | Discharge: 2020-06-26 | Disposition: A | Discharge status: Home / Self Care | Payer: BLUE CROSS/BLUE SHIELD | Attending: Emergency Medicine | Admitting: Emergency Medicine

## 2020-06-26 ENCOUNTER — Other Ambulatory Visit: Payer: Self-pay

## 2020-06-26 DIAGNOSIS — R350 Frequency of micturition: Secondary | ICD-10-CM | POA: Insufficient documentation

## 2020-06-26 DIAGNOSIS — Z7984 Long term (current) use of oral hypoglycemic drugs: Secondary | ICD-10-CM | POA: Insufficient documentation

## 2020-06-26 DIAGNOSIS — R631 Polydipsia: Secondary | ICD-10-CM | POA: Insufficient documentation

## 2020-06-26 DIAGNOSIS — E119 Type 2 diabetes mellitus without complications: Secondary | ICD-10-CM | POA: Insufficient documentation

## 2020-06-26 DIAGNOSIS — Z5321 Procedure and treatment not carried out due to patient leaving prior to being seen by health care provider: Secondary | ICD-10-CM | POA: Insufficient documentation

## 2020-06-26 LAB — BASIC METABOLIC PANEL
Anion gap: 10 (ref 5–15)
BUN: 14 mg/dL (ref 6–20)
CO2: 24 mmol/L (ref 22–32)
Calcium: 9.4 mg/dL (ref 8.9–10.3)
Chloride: 104 mmol/L (ref 98–111)
Creatinine, Ser: 1.03 mg/dL (ref 0.61–1.24)
GFR calc Af Amer: >60 mL/min (ref >60)
GFR calc non Af Amer: >60 mL/min (ref >60)
Glucose, Bld: 114 mg/dL — ABNORMAL HIGH (ref 70–99)
Potassium: 4.7 mmol/L (ref 3.5–5.1)
Sodium: 138 mmol/L (ref 135–145)

## 2020-06-26 LAB — CBC
HCT: 46.3 % (ref 39.0–52.0)
Hemoglobin: 14.4 g/dL (ref 13.0–17.0)
MCH: 26.8 pg (ref 26.0–34.0)
MCHC: 31.1 g/dL (ref 30.0–36.0)
MCV: 86.2 fL (ref 80.0–100.0)
Platelets: 203 10*3/uL (ref 150–400)
RBC: 5.37 MIL/uL (ref 4.22–5.81)
RDW: 13.7 % (ref 11.5–15.5)
WBC: 4.9 10*3/uL (ref 4.0–10.5)
nRBC: 0 % (ref 0.0–0.2)

## 2020-06-26 LAB — CBG MONITORING, ED
Glucose-Capillary: 109 mg/dL — ABNORMAL HIGH (ref 70–99)
Glucose-Capillary: 86 mg/dL (ref 70–99)

## 2020-06-26 NOTE — ED Triage Notes (Signed)
Onset 2-3 weeks increase thirst and urinary frequency. States has diabetes and blood sugars have been high 400-500. Took blood sugar this morning and it was 120. Concerned blood sugars are getting to high during the day and has been taking medication as directed.

## 2020-11-17 ENCOUNTER — Emergency Department (HOSPITAL_COMMUNITY)
Admission: EM | Admit: 2020-11-17 | Discharge: 2020-11-18 | Disposition: A | Discharge status: Home / Self Care | Attending: Emergency Medicine | Admitting: Emergency Medicine

## 2020-11-17 ENCOUNTER — Other Ambulatory Visit: Payer: Self-pay

## 2020-11-17 ENCOUNTER — Emergency Department (HOSPITAL_COMMUNITY)

## 2020-11-17 ENCOUNTER — Encounter (HOSPITAL_COMMUNITY): Payer: Self-pay

## 2020-11-17 DIAGNOSIS — S4991XA Unspecified injury of right shoulder and upper arm, initial encounter: Secondary | ICD-10-CM | POA: Diagnosis not present

## 2020-11-17 DIAGNOSIS — Z833 Family history of diabetes mellitus: Secondary | ICD-10-CM | POA: Insufficient documentation

## 2020-11-17 DIAGNOSIS — Z79890 Hormone replacement therapy (postmenopausal): Secondary | ICD-10-CM | POA: Diagnosis not present

## 2020-11-17 DIAGNOSIS — Z794 Long term (current) use of insulin: Secondary | ICD-10-CM | POA: Insufficient documentation

## 2020-11-17 DIAGNOSIS — M25511 Pain in right shoulder: Secondary | ICD-10-CM

## 2020-11-17 DIAGNOSIS — E1065 Type 1 diabetes mellitus with hyperglycemia: Secondary | ICD-10-CM | POA: Diagnosis not present

## 2020-11-17 DIAGNOSIS — X500XXA Overexertion from strenuous movement or load, initial encounter: Secondary | ICD-10-CM | POA: Insufficient documentation

## 2020-11-17 LAB — BASIC METABOLIC PANEL
Anion gap: 10 (ref 5–15)
BUN: 16 mg/dL (ref 6–20)
CO2: 22 mmol/L (ref 22–32)
Calcium: 8.9 mg/dL (ref 8.9–10.3)
Chloride: 103 mmol/L (ref 98–111)
Creatinine, Ser: 1.22 mg/dL (ref 0.61–1.24)
GFR, Estimated: >60 mL/min (ref >60)
Glucose, Bld: 393 mg/dL — ABNORMAL HIGH (ref 70–99)
Potassium: 4.3 mmol/L (ref 3.5–5.1)
Sodium: 135 mmol/L (ref 135–145)

## 2020-11-17 LAB — URINALYSIS, ROUTINE W REFLEX MICROSCOPIC
Bacteria, UA: NONE SEEN
Bilirubin Urine: NEGATIVE
Glucose, UA: >=500 mg/dL — AB
Hgb urine dipstick: NEGATIVE
Ketones, ur: NEGATIVE mg/dL
Leukocytes,Ua: NEGATIVE
Nitrite: NEGATIVE
Protein, ur: NEGATIVE mg/dL
Specific Gravity, Urine: 1.04 — ABNORMAL HIGH (ref 1.005–1.030)
pH: 5 (ref 5.0–8.0)

## 2020-11-17 LAB — CBC
HCT: 44.5 % (ref 39.0–52.0)
Hemoglobin: 14 g/dL (ref 13.0–17.0)
MCH: 27.2 pg (ref 26.0–34.0)
MCHC: 31.5 g/dL (ref 30.0–36.0)
MCV: 86.4 fL (ref 80.0–100.0)
Platelets: 201 10*3/uL (ref 150–400)
RBC: 5.15 MIL/uL (ref 4.22–5.81)
RDW: 13.7 % (ref 11.5–15.5)
WBC: 7 10*3/uL (ref 4.0–10.5)
nRBC: 0 % (ref 0.0–0.2)

## 2020-11-17 LAB — CBG MONITORING, ED: Glucose-Capillary: 351 mg/dL — ABNORMAL HIGH (ref 70–99)

## 2020-11-17 NOTE — ED Provider Notes (Signed)
[facility]-EMERGENCY DEPT Provider Note   CSN: [id number] Arrival date & time: 11/17/20  2153     History Chief Complaint  Patient presents with  . Shoulder Injury  . Hyperglycemia    [name] is a 50 y.o. male.   Shoulder Injury This is a new problem. The current episode started more than 2 days ago. The problem occurs daily. Progression since onset: intermittently hurts. Pertinent negatives include no chest pain, no abdominal pain, no headaches and no shortness of breath. Exacerbated by: ROM and use. Relieved by: rest. He has tried nothing for the symptoms. The treatment provided no relief.  Hyperglycemia Associated symptoms: no abdominal pain, no chest pain, no dysuria, no fever, no nausea, no shortness of breath and no vomiting        Past Medical History:  Diagnosis Date  . Diabetes mellitus without complication (HCC)   . Thyroid disease     Patient Active Problem List   Diagnosis Date Noted  . Type 1 diabetes mellitus with hyperglycemia (HCC) 12/09/2018    Past Surgical History:  Procedure Laterality Date  . HEMATOMA EVACUATION     on L leg       Family History  Problem Relation Age of Onset  . Diabetes Mother   . Diabetes Maternal Grandmother     Social History   Tobacco Use  . Smoking status: Never Smoker  . Smokeless tobacco: Never Used  Substance Use Topics  . Alcohol use: No  . Drug use: No    Home Medications Prior to Admission medications   Medication Sig Start Date End Date Taking? Authorizing Provider  insulin degludec (TRESIBA FLEXTOUCH) 100 UNIT/ML SOPN FlexTouch Pen Inject 0.2 mLs (20 Units total) into the skin at bedtime. 01/19/19   Shamleffer, Konrad Dolores, MD  insulin lispro (HUMALOG KWIKPEN) 100 UNIT/ML KwikPen Inject 0.1 mLs (10 Units total) into the skin 3 (three) times daily. Max daily 60 units 01/19/19   Shamleffer, Konrad Dolores, MD  levothyroxine (SYNTHROID, LEVOTHROID) 175 MCG tablet Take  175 mcg by mouth daily before breakfast.     [provider]    Allergies    Patient has no known allergies.  Review of Systems   Review of Systems  Constitutional: Negative for chills and fever.  HENT: Negative for congestion and rhinorrhea.   Respiratory: Negative for cough and shortness of breath.   Cardiovascular: Negative for chest pain and palpitations.  Gastrointestinal: Negative for abdominal pain, diarrhea, nausea and vomiting.  Genitourinary: Negative for difficulty urinating and dysuria.  Musculoskeletal: Positive for arthralgias. Negative for back pain.  Skin: Negative for color change and rash.  Neurological: Negative for light-headedness and headaches.    Physical Exam Updated Vital Signs BP (!) 133/102   Pulse 86   Temp 98.1 F (36.7 C) (Oral)   Resp 19   Ht 6\' 1"  (1.854 m)   Wt 96.2 kg   SpO2 95%   BMI 27.97 kg/m   Physical Exam Vitals and nursing note reviewed. Exam conducted with a chaperone present.  Constitutional:      General: He is not in acute distress.    Appearance: Normal appearance.  HENT:     Head: Normocephalic and atraumatic.     Nose: No rhinorrhea.  Eyes:     General:        Right eye: No discharge.        Left eye: No discharge.     Conjunctiva/sclera: Conjunctivae normal.  Cardiovascular:  Rate and Rhythm: Normal rate and regular rhythm.  Pulmonary:     Effort: Pulmonary effort is normal.     Breath sounds: No stridor.  Abdominal:     General: Abdomen is flat. There is no distension.     Palpations: Abdomen is soft.  Musculoskeletal:        General: Tenderness present. No deformity or signs of injury.     Comments: Full range of motion but pain with extremes of range of motion of the shoulder.  Tenderness at the biceps tendon insertion site.  Neurovascularly intact no focal bony tenderness  Skin:    General: Skin is warm and dry.  Neurological:     General: No focal deficit present.     Mental Status: He is  alert. Mental status is at baseline.     Motor: No weakness.  Psychiatric:        Mood and Affect: Mood normal.        Behavior: Behavior normal.        Thought Content: Thought content normal.     ED Results / Procedures / Treatments   Labs (all labs ordered are listed, but only abnormal results are displayed) Labs Reviewed  BASIC METABOLIC PANEL - Abnormal; Notable for the following components:      Result Value   Glucose, Bld 393 (*)    All other components within normal limits  URINALYSIS, ROUTINE W REFLEX MICROSCOPIC - Abnormal; Notable for the following components:   Specific Gravity, Urine 1.040 (*)    Glucose, UA >=500 (*)    All other components within normal limits  CBG MONITORING, ED - Abnormal; Notable for the following components:   Glucose-Capillary 351 (*)    All other components within normal limits  CBC    EKG None  Radiology DG Shoulder Right  Result Date: 11/17/2020 CLINICAL DATA:  Progressive right shoulder pain EXAM: RIGHT SHOULDER - 2+ VIEW COMPARISON:  None. FINDINGS: Frontal, transscapular, and axillary views of the right shoulder are obtained. No fracture, subluxation, or dislocation. Mild to moderate glenohumeral joint space narrowing and osteophyte formation. Mild hypertrophic changes of the acromioclavicular joint. The right chest is clear. IMPRESSION: 1. 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 No acute fracture. Electronically Signed   By: Sharlet Salina M.D.   On: 11/17/2020 22:41    Procedures Procedures   Medications Ordered in ED Medications - No data to display  ED Course  I have reviewed the triage vital signs and the nursing notes.  Pertinent labs & imaging results that were available during my care of the patient were reviewed by me and considered in my medical decision making (see chart for details).  Clinical Course as of 11/17/20 2357  Fri Nov 17, 2020  2324 DG Shoulder Right [EK]    Clinical Course  User Index [EK] Sabino Donovan, MD   MDM Rules/Calculators/A&P                          Patient is here after a injury to his right shoulder, he was pulling something heavy and now has what I believe to be a biceps tendinopathy.  He is given anti-inflammatories he is given range of motion exercise for shoulder and outpatient orthopedic follow-up.  His glucose is elevated here but there are no other derangements and he controls this with insulin at home and has a plan for dealing with elevated glucose.  He denies any other injury  and none are found on exam.  Return precautions given x-rays reviewed by myself and radiology show no acute fracture or malalignment. Final Clinical Impression(s) / ED Diagnoses Final diagnoses:  Acute pain of right shoulder    Rx / DC Orders ED Discharge Orders    None       Sabino Donovan, MD 11/17/20 2357

## 2020-11-17 NOTE — Discharge Instructions (Signed)
You can take 600 mg of ibuprofen every 6 hours, you can take 1000 mg of Tylenol every 6 hours, you can alternate these every 3 or you can take them together.  

## 2020-11-17 NOTE — ED Triage Notes (Signed)
Patient is complaining of right shoulder pain. Patient states last Friday he hurt it at work. Patient states that has not gotten any better.

## 2020-11-17 NOTE — ED Notes (Signed)
Pt provided labeled specimen cup for urine collection. ENMiles 

## 2020-11-18 NOTE — ED Notes (Signed)
Dr. Myrtis Ser aware of patients high blood sugar before discharge.

## 2021-02-23 DIAGNOSIS — Z794 Long term (current) use of insulin: Secondary | ICD-10-CM | POA: Diagnosis not present

## 2021-02-23 DIAGNOSIS — E039 Hypothyroidism, unspecified: Secondary | ICD-10-CM | POA: Diagnosis not present

## 2021-02-23 DIAGNOSIS — E119 Type 2 diabetes mellitus without complications: Secondary | ICD-10-CM | POA: Diagnosis not present

## 2021-03-27 DIAGNOSIS — E1069 Type 1 diabetes mellitus with other specified complication: Secondary | ICD-10-CM | POA: Diagnosis not present

## 2021-03-27 DIAGNOSIS — E6609 Other obesity due to excess calories: Secondary | ICD-10-CM | POA: Diagnosis not present

## 2021-03-27 DIAGNOSIS — E0590 Thyrotoxicosis, unspecified without thyrotoxic crisis or storm: Secondary | ICD-10-CM | POA: Diagnosis not present

## 2021-04-04 DIAGNOSIS — E1169 Type 2 diabetes mellitus with other specified complication: Secondary | ICD-10-CM | POA: Diagnosis not present

## 2021-04-04 DIAGNOSIS — E559 Vitamin D deficiency, unspecified: Secondary | ICD-10-CM | POA: Diagnosis not present

## 2021-04-04 DIAGNOSIS — E7800 Pure hypercholesterolemia, unspecified: Secondary | ICD-10-CM | POA: Diagnosis not present

## 2021-04-04 DIAGNOSIS — Z8616 Personal history of COVID-19: Secondary | ICD-10-CM | POA: Diagnosis not present

## 2021-05-16 DIAGNOSIS — E6609 Other obesity due to excess calories: Secondary | ICD-10-CM | POA: Diagnosis not present

## 2021-05-16 DIAGNOSIS — U071 COVID-19: Secondary | ICD-10-CM | POA: Diagnosis not present

## 2021-05-16 DIAGNOSIS — Z6828 Body mass index (BMI) 28.0-28.9, adult: Secondary | ICD-10-CM | POA: Diagnosis not present

## 2021-05-16 DIAGNOSIS — E1169 Type 2 diabetes mellitus with other specified complication: Secondary | ICD-10-CM | POA: Diagnosis not present

## 2021-05-16 DIAGNOSIS — E034 Atrophy of thyroid (acquired): Secondary | ICD-10-CM | POA: Diagnosis not present

## 2021-05-16 DIAGNOSIS — E0590 Thyrotoxicosis, unspecified without thyrotoxic crisis or storm: Secondary | ICD-10-CM | POA: Diagnosis not present

## 2021-08-15 DIAGNOSIS — E6609 Other obesity due to excess calories: Secondary | ICD-10-CM | POA: Diagnosis not present

## 2021-08-15 DIAGNOSIS — E1169 Type 2 diabetes mellitus with other specified complication: Secondary | ICD-10-CM | POA: Diagnosis not present

## 2021-08-15 DIAGNOSIS — E0590 Thyrotoxicosis, unspecified without thyrotoxic crisis or storm: Secondary | ICD-10-CM | POA: Diagnosis not present

## 2021-08-15 DIAGNOSIS — E559 Vitamin D deficiency, unspecified: Secondary | ICD-10-CM | POA: Diagnosis not present

## 2021-08-28 DIAGNOSIS — Z20822 Contact with and (suspected) exposure to covid-19: Secondary | ICD-10-CM | POA: Diagnosis not present

## 2021-11-16 ENCOUNTER — Encounter (HOSPITAL_COMMUNITY): Payer: Self-pay

## 2021-11-16 ENCOUNTER — Emergency Department (HOSPITAL_COMMUNITY)
Admission: EM | Admit: 2021-11-16 | Discharge: 2021-11-16 | Disposition: A | Discharge status: Home / Self Care | Payer: Federal, State, Local not specified - PPO | Attending: Emergency Medicine | Admitting: Emergency Medicine

## 2021-11-16 ENCOUNTER — Other Ambulatory Visit: Payer: Self-pay

## 2021-11-16 DIAGNOSIS — E11649 Type 2 diabetes mellitus with hypoglycemia without coma: Secondary | ICD-10-CM | POA: Insufficient documentation

## 2021-11-16 DIAGNOSIS — Z794 Long term (current) use of insulin: Secondary | ICD-10-CM | POA: Diagnosis not present

## 2021-11-16 DIAGNOSIS — E162 Hypoglycemia, unspecified: Secondary | ICD-10-CM

## 2021-11-16 LAB — CBC WITH DIFFERENTIAL/PLATELET
Abs Immature Granulocytes: 0.09 10*3/uL — ABNORMAL HIGH (ref 0.00–0.07)
Basophils Absolute: 0.1 10*3/uL (ref 0.0–0.1)
Basophils Relative: 0 %
Eosinophils Absolute: 0 10*3/uL (ref 0.0–0.5)
Eosinophils Relative: 0 %
HCT: 47.5 % (ref 39.0–52.0)
Hemoglobin: 14.7 g/dL (ref 13.0–17.0)
Immature Granulocytes: 1 %
Lymphocytes Relative: 7 %
Lymphs Abs: 1.1 10*3/uL (ref 0.7–4.0)
MCH: 27.3 pg (ref 26.0–34.0)
MCHC: 30.9 g/dL (ref 30.0–36.0)
MCV: 88.1 fL (ref 80.0–100.0)
Monocytes Absolute: 0.8 10*3/uL (ref 0.1–1.0)
Monocytes Relative: 5 %
Neutro Abs: 14.7 10*3/uL — ABNORMAL HIGH (ref 1.7–7.7)
Neutrophils Relative %: 87 %
Platelets: 204 10*3/uL (ref 150–400)
RBC: 5.39 MIL/uL (ref 4.22–5.81)
RDW: 14.5 % (ref 11.5–15.5)
WBC: 16.7 10*3/uL — ABNORMAL HIGH (ref 4.0–10.5)
nRBC: 0 % (ref 0.0–0.2)

## 2021-11-16 LAB — BASIC METABOLIC PANEL
Anion gap: 11 (ref 5–15)
BUN: 14 mg/dL (ref 6–20)
CO2: 23 mmol/L (ref 22–32)
Calcium: 9.3 mg/dL (ref 8.9–10.3)
Chloride: 104 mmol/L (ref 98–111)
Creatinine, Ser: 1.14 mg/dL (ref 0.61–1.24)
GFR, Estimated: >60 mL/min (ref >60)
Glucose, Bld: 136 mg/dL — ABNORMAL HIGH (ref 70–99)
Potassium: 3.4 mmol/L — ABNORMAL LOW (ref 3.5–5.1)
Sodium: 138 mmol/L (ref 135–145)

## 2021-11-16 LAB — CBG MONITORING, ED
Glucose-Capillary: 76 mg/dL (ref 70–99)
Glucose-Capillary: 160 mg/dL — ABNORMAL HIGH (ref 70–99)
Glucose-Capillary: 193 mg/dL — ABNORMAL HIGH (ref 70–99)

## 2021-11-16 NOTE — ED Triage Notes (Addendum)
BIB EMS, found in car in [employer] parking lot. Pt was unresponsive and combative. Initial CBG was 34. EMS was unable to get a line but gave pt oral glucagon. Hx diabetes. Pt is currently oriented but drowsy. Pt reports that they were on their way to get something to eat so they took their insulin but did not eat something in time.

## 2021-11-16 NOTE — ED Notes (Signed)
Spoke with the lab regarding results, per lab they are having trouble with the machines, and will have a result as soon as possible.

## 2021-11-16 NOTE — ED Provider Notes (Addendum)
[facility] DEPT Provider Note   CSN: [id number] Arrival date & time: 11/16/21  1847     History  Chief Complaint  Patient presents with   Hypoglycemia    [name] is a 51 y.o. male.  Patient brought in by EMS.  Patient was found in car hours to the parking lot unresponsive and combative.  Initial blood sugar was 34.  MS gave him more glucagon.  Patient has a history of diabetes.  Currently oriented but drowsy.  When I saw him he was quite alert.  Patient reports that they were on their way to get something to eat took his insulin before he had anything to eat.  Patient states he felt fine earlier today and feels fine now.  Past medical history significant for diabetes without complications and thyroid disease.  Patient is on Humalog KwikPen.        Home Medications Prior to Admission medications   Medication Sig Start Date End Date Taking? Authorizing Provider  insulin degludec (TRESIBA FLEXTOUCH) 100 UNIT/ML SOPN FlexTouch Pen Inject 0.2 mLs (20 Units total) into the skin at bedtime. 01/19/19   Shamleffer, Melanie Crazier, MD  insulin lispro (HUMALOG KWIKPEN) 100 UNIT/ML KwikPen Inject 0.1 mLs (10 Units total) into the skin 3 (three) times daily. Max daily 60 units 01/19/19   Shamleffer, Melanie Crazier, MD  levothyroxine (SYNTHROID, LEVOTHROID) 175 MCG tablet Take 175 mcg by mouth daily before breakfast.     [provider]      Allergies    Patient has no known allergies.    Review of Systems   Review of Systems  Constitutional:  Negative for chills and fever.  HENT:  Negative for ear pain and sore throat.   Eyes:  Negative for pain and visual disturbance.  Respiratory:  Negative for cough and shortness of breath.   Cardiovascular:  Negative for chest pain and palpitations.  Gastrointestinal:  Negative for abdominal pain and vomiting.  Genitourinary:  Negative for dysuria and hematuria.  Musculoskeletal:  Negative for  arthralgias and back pain.  Skin:  Negative for color change and rash.  Neurological:  Negative for seizures and syncope.  All other systems reviewed and are negative.  Physical Exam Updated Vital Signs BP (!) 156/88 (BP Location: Left Arm)    Pulse 81    Temp 98.7 F (37.1 C) (Oral)    Resp 16    Ht 1.829 m (6')    Wt 99.8 kg    SpO2 100%    BMI 29.84 kg/m  Physical Exam Vitals and nursing note reviewed.  Constitutional:      General: He is not in acute distress.    Appearance: Normal appearance. He is well-developed. He is not ill-appearing.  HENT:     Head: Normocephalic and atraumatic.  Eyes:     Extraocular Movements: Extraocular movements intact.     Conjunctiva/sclera: Conjunctivae normal.     Pupils: Pupils are equal, round, and reactive to light.  Cardiovascular:     Rate and Rhythm: Normal rate and regular rhythm.     Heart sounds: No murmur heard. Pulmonary:     Effort: Pulmonary effort is normal. No respiratory distress.     Breath sounds: Normal breath sounds.  Abdominal:     Palpations: Abdomen is soft.     Tenderness: There is no abdominal tenderness.  Musculoskeletal:        General: No swelling.     Cervical back: Neck supple.  Skin:  General: Skin is warm and dry.     Capillary Refill: Capillary refill takes less than 2 seconds.  Neurological:     General: No focal deficit present.     Mental Status: He is alert and oriented to person, place, and time.     Cranial Nerves: No cranial nerve deficit.     Sensory: No sensory deficit.     Motor: No weakness.  Psychiatric:        Mood and Affect: Mood normal.    ED Results / Procedures / Treatments   Labs (all labs ordered are listed, but only abnormal results are displayed) Labs Reviewed  CBC WITH DIFFERENTIAL/PLATELET  BASIC METABOLIC PANEL  CBG MONITORING, ED    EKG None  Radiology No results found.  Procedures Procedures    Medications Ordered in ED Medications - No data to  display  ED Course/ Medical Decision Making/ A&P                           Medical Decision Making Amount and/or Complexity of Data Reviewed Labs: ordered.  Patient blood sugar upon arrival here was 76.  Patient has CBC basic metabolic panel pending.  We will feed patient.  Make sure he is able to keep his blood sugar up.  Patient currently completely asymptomatic.  Patient's blood sugar holding after eating at 160.  White blood cell count significant for 16,000.  Hemoglobin normal at 14.7.  Differential is just absolute neutrophil increase.  Could be secondary to the hypoglycemia and a stress reaction  Patient can follow-up with his primary care doctor for this.  Potassium slightly down at 3.4 but not significant.  Patient feels fine.  Will give work note.  The patient can make sure his blood sugar stabilized.  Final Clinical Impression(s) / ED Diagnoses Final diagnoses:  Hypoglycemia    Rx / DC Orders ED Discharge Orders     None         Fredia Sorrow, MD 11/16/21 2055    Fredia Sorrow, MD 11/16/21 [id number]    Fredia Sorrow, MD 11/16/21 [id number]    Fredia Sorrow, MD 11/16/21 2312

## 2021-11-16 NOTE — ED Notes (Signed)
Pt discharged. Instructions given. AAOX4. Pt in no apparent distress or pain. The opportunity to ask questions was provided.  

## 2021-11-16 NOTE — Discharge Instructions (Addendum)
Blood sugar is now corrected.  Continue to make sure you eat properly before taking your insulin.  Or shortly after taking your insulin.  Make an appointment follow-up with your primary care doctor.  Return for any new or worse symptoms.  Your white blood cell count was elevated may want to follow-up with your primary care doctor to have that rechecked.  Work note provided.

## 2021-12-09 ENCOUNTER — Encounter (HOSPITAL_COMMUNITY): Payer: Self-pay

## 2021-12-09 ENCOUNTER — Emergency Department (HOSPITAL_COMMUNITY)
Admission: EM | Admit: 2021-12-09 | Discharge: 2021-12-09 | Disposition: A | Discharge status: Home / Self Care | Payer: Federal, State, Local not specified - PPO | Attending: Emergency Medicine | Admitting: Emergency Medicine

## 2021-12-09 ENCOUNTER — Other Ambulatory Visit: Payer: Self-pay

## 2021-12-09 DIAGNOSIS — Z794 Long term (current) use of insulin: Secondary | ICD-10-CM | POA: Diagnosis not present

## 2021-12-09 DIAGNOSIS — E860 Dehydration: Secondary | ICD-10-CM | POA: Insufficient documentation

## 2021-12-09 DIAGNOSIS — E1065 Type 1 diabetes mellitus with hyperglycemia: Secondary | ICD-10-CM | POA: Diagnosis not present

## 2021-12-09 DIAGNOSIS — R739 Hyperglycemia, unspecified: Secondary | ICD-10-CM

## 2021-12-09 DIAGNOSIS — R5383 Other fatigue: Secondary | ICD-10-CM | POA: Diagnosis present

## 2021-12-09 LAB — URINALYSIS, ROUTINE W REFLEX MICROSCOPIC
Bilirubin Urine: NEGATIVE
Glucose, UA: >=500 mg/dL — AB
Hgb urine dipstick: NEGATIVE
Ketones, ur: NEGATIVE mg/dL
Leukocytes,Ua: NEGATIVE
Nitrite: NEGATIVE
Protein, ur: NEGATIVE mg/dL
Specific Gravity, Urine: 1.015 (ref 1.005–1.030)
pH: 5.5 (ref 5.0–8.0)

## 2021-12-09 LAB — BASIC METABOLIC PANEL
Anion gap: 7 (ref 5–15)
BUN: 17 mg/dL (ref 6–20)
CO2: 25 mmol/L (ref 22–32)
Calcium: 9.1 mg/dL (ref 8.9–10.3)
Chloride: 100 mmol/L (ref 98–111)
Creatinine, Ser: 1.07 mg/dL (ref 0.61–1.24)
GFR, Estimated: >60 mL/min (ref >60)
Glucose, Bld: 390 mg/dL — ABNORMAL HIGH (ref 70–99)
Potassium: 4.4 mmol/L (ref 3.5–5.1)
Sodium: 132 mmol/L — ABNORMAL LOW (ref 135–145)

## 2021-12-09 LAB — CBG MONITORING, ED
Glucose-Capillary: 374 mg/dL — ABNORMAL HIGH (ref 70–99)
Glucose-Capillary: 284 mg/dL — ABNORMAL HIGH (ref 70–99)
Glucose-Capillary: 240 mg/dL — ABNORMAL HIGH (ref 70–99)

## 2021-12-09 LAB — BLOOD GAS, VENOUS
Acid-Base Excess: 4.8 mmol/L — ABNORMAL HIGH (ref 0.0–2.0)
Bicarbonate: 29.9 mmol/L — ABNORMAL HIGH (ref 20.0–28.0)
O2 Saturation: 87.9 %
Patient temperature: 37
pCO2, Ven: 45 mmHg (ref 44–60)
pH, Ven: 7.43 (ref 7.25–7.43)
pO2, Ven: 55 mmHg — ABNORMAL HIGH (ref 32–45)

## 2021-12-09 LAB — CBC
HCT: 42.1 % (ref 39.0–52.0)
Hemoglobin: 13.9 g/dL (ref 13.0–17.0)
MCH: 27.7 pg (ref 26.0–34.0)
MCHC: 33 g/dL (ref 30.0–36.0)
MCV: 83.9 fL (ref 80.0–100.0)
Platelets: 191 10*3/uL (ref 150–400)
RBC: 5.02 MIL/uL (ref 4.22–5.81)
RDW: 14.6 % (ref 11.5–15.5)
WBC: 5.4 10*3/uL (ref 4.0–10.5)
nRBC: 0 % (ref 0.0–0.2)

## 2021-12-09 LAB — URINALYSIS, MICROSCOPIC (REFLEX)

## 2021-12-09 LAB — BETA-HYDROXYBUTYRIC ACID: Beta-Hydroxybutyric Acid: 0.1 mmol/L (ref 0.05–0.27)

## 2021-12-09 MED ORDER — SODIUM CHLORIDE 0.9 % IV BOLUS
1000.0000 mL | Freq: Once | INTRAVENOUS | Status: AC
  Administered 2021-12-09: 1000 mL via INTRAVENOUS

## 2021-12-09 NOTE — ED Triage Notes (Signed)
Pt reports having glucose in the 300s over the last week and today his meter is reading "high". Pt reports feeling dehydrated at this time.  ?

## 2021-12-09 NOTE — ED Provider Notes (Signed)
?[name] ?Provider Note ? ? ?CSN: [id number] ?Arrival date & time: 12/09/21  1607 ? ?  ? ?History ? ?Chief Complaint  ?Patient presents with  ? Hyperglycemia  ? ? ?[name] is a 51 y.o. male. ? ? ?Hyperglycemia ?Patient is a 51 year old male with past medical history significant for DM1 insulin-dependent uses degludec long-acting and NovoLog short acting. ? ?Presented emergency room today with complaint of elevated blood sugar for the past 3 weeks.  Seems that he has had some polyuria polydipsia and perhaps some fatigue denies any pain no nausea or vomiting no lightheadedness or dizziness. ? ?States that he had a endocrinologist 3 to 4 years ago but has not seen them since COVID. ? ?  ? ?Home Medications ?Prior to Admission medications   ?Medication Sig Start Date End Date Taking? Authorizing Provider  ?insulin degludec (TRESIBA FLEXTOUCH) 100 UNIT/ML SOPN FlexTouch Pen Inject 0.2 mLs (20 Units total) into the skin at bedtime. 01/19/19   Shamleffer, Konrad Dolores, MD  ?insulin lispro (HUMALOG KWIKPEN) 100 UNIT/ML KwikPen Inject 0.1 mLs (10 Units total) into the skin 3 (three) times daily. Max daily 60 units 01/19/19   Shamleffer, Konrad Dolores, MD  ?levothyroxine (SYNTHROID, LEVOTHROID) 175 MCG tablet Take 175 mcg by mouth daily before breakfast.     [provider]  ?   ? ?Allergies    ?Patient has no known allergies.   ? ?Review of Systems   ?Review of Systems ? ?Physical Exam ?Updated Vital Signs ?BP (!) 125/91   Pulse 71   Temp 97.8 ?F (36.6 ?C) (Oral)   Resp 16   SpO2 94%  ?Physical Exam ?Vitals and nursing note reviewed.  ?Constitutional:   ?   General: He is not in acute distress. ?HENT:  ?   Head: Normocephalic and atraumatic.  ?   Nose: Nose normal.  ?   Mouth/Throat:  ?   Mouth: Mucous membranes are dry.  ?Eyes:  ?   General: No scleral icterus. ?Cardiovascular:  ?   Rate and Rhythm: Normal rate and regular rhythm.  ?   Pulses: Normal pulses.   ?   Heart sounds: Normal heart sounds.  ?Pulmonary:  ?   Effort: Pulmonary effort is normal. No respiratory distress.  ?   Breath sounds: No wheezing.  ?Abdominal:  ?   Palpations: Abdomen is soft.  ?   Tenderness: There is no abdominal tenderness. There is no guarding or rebound.  ?Musculoskeletal:  ?   Cervical back: Normal range of motion.  ?   Right lower leg: No edema.  ?   Left lower leg: No edema.  ?Skin: ?   General: Skin is warm and dry.  ?   Capillary Refill: Capillary refill takes less than 2 seconds.  ?Neurological:  ?   Mental Status: He is alert. Mental status is at baseline.  ?Psychiatric:     ?   Mood and Affect: Mood normal.     ?   Behavior: Behavior normal.  ? ? ?ED Results / Procedures / Treatments   ?Labs ?(all labs ordered are listed, but only abnormal results are displayed) ?Labs Reviewed  ?BASIC METABOLIC PANEL - Abnormal; Notable for the following components:  ?    Result Value  ? Sodium 132 (*)   ? Glucose, Bld 390 (*)   ? All other components within normal limits  ?BLOOD GAS, VENOUS - Abnormal; Notable for the following components:  ? pO2, Ven 55 (*)   ?  Bicarbonate 29.9 (*)   ? Acid-Base Excess 4.8 (*)   ? All other components within normal limits  ?CBG MONITORING, ED - Abnormal; Notable for the following components:  ? Glucose-Capillary 374 (*)   ? All other components within normal limits  ?CBC  ?URINALYSIS, ROUTINE W REFLEX MICROSCOPIC  ?BETA-HYDROXYBUTYRIC ACID  ? ? ?EKG ?EKG Interpretation ? ?Date/Time:  Sunday December 09 2021 17:37:17 EDT ?Ventricular Rate:  77 ?PR Interval:  184 ?QRS Duration: 97 ?QT Interval:  393 ?QTC Calculation: 445 ?R Axis:   27 ?Text Interpretation: Sinus rhythm RSR' in V1 or V2, right VCD or RVH ST-t wave abnormality Baseline wander Abnormal ECG Confirmed by Gerhard Munch [phone]) on 12/09/2021 5:41:00 PM ? ?Radiology ?No results found. ? ?Procedures ?Procedures  ? ? ?Medications Ordered in ED ?Medications  ?sodium chloride 0.9 % bolus 1,000 mL (has no  administration in time range)  ?sodium chloride 0.9 % bolus 1,000 mL (1,000 mLs Intravenous New Bag/Given 12/09/21 1736)  ? ? ?ED Course/ Medical Decision Making/ A&P ?  ?                        ?Medical Decision Making ?Amount and/or Complexity of Data Reviewed ?Labs: ordered. ? ? ?This patient presents to the ED for concern of hyperglycemia, this involves a number of treatment options, and is a complaint that carries with it a high risk of complications and morbidity.  The differential diagnosis includes DKA/HHS/hyperglycemia  ? ? ?Co morbidities: ?Discussed in HPI ? ? ?Brief History: ? ?Patient is a 51 year old male presented emergency room today with complaints of polyuria polydipsia.  No pain no urinary issues other than the polyuria and no fevers no abdominal pain nausea vomiting or diarrhea.  See ? ?That his blood sugars have been elevated for the past 3 weeks. ? ?Physical exam unremarkable apart from dry oral mucosa ? ? ? ?EMR reviewed including pt PMHx, past surgical history and past visits to ER.  ? ?See HPI for more details ? ? ?Lab Tests: ? ? ?I ordered and independently interpreted labs. Labs notable for ?Hyperglycemia blood sugar is 390 on BMP mild hyponatremia which corrects to normal with correction for hyperglycemia. ?Corrected sodium is 137 ? ?Without acidosis on VBG CBC unremarkable. ? ?Imaging Studies: ? ?No imaging studies ordered for this patient ? ? ? ?Cardiac Monitoring: ? ?NA ?EKG non-ischemic no peaked T waves or U waves present ? ? ?Medicines ordered: ? ?I ordered medication including normal saline 2 L for hyperglycemia and dehydration ?Reevaluation of the patient after these medicines showed that the patient improved on reassessment is only received 200 mL so far however. ?I have reviewed the patients home medicines and have made adjustments as needed ? ? ?Critical Interventions: ? ? ? ? ?Consults/Attending Physician ? ? ? ? ? ?Reevaluation: ? ?After the interventions noted above I  re-evaluated patient and found that they have :improved ? ? ?Social Determinants of Health: ? ?The patient's social determinants of health were a factor in the care of this patient ? ? ? ?Problem List / ED Course: ? ?Hyperglycemia ?Dehydration ? ?Patient is not in DKA well-appearing hyperglycemia seems to have been ongoing for several weeks.  Will need to follow-up with endocrinology.  Patient care signed out to oncoming team to reassess after hydration.  Anticipate discharge home.  Patient is agreeable with plan. ? ? ? ?Final Clinical Impression(s) / ED Diagnoses ?Final diagnoses:  ?Hyperglycemia  ? ? ?Rx / DC  Orders ?ED Discharge Orders   ? ? None  ? ?  ? ? ?  ?Gailen ShelterFondaw, Emeka Lindner S, [place] ?12/09/21 1813 ? ?  ?Gerhard MunchLockwood, Robert, MD ?12/09/21 2223 ? ?

## 2021-12-09 NOTE — Discharge Instructions (Addendum)
Your blood sugars are elevated today however you are not in diabetic ketoacidosis.  Please hydrate, continue to use your insulin regimen as prescribed and follow-up closely with the endocrinologist and your primary care provider.  May always return to the emergency room for any new or concerning symptoms.  ?

## 2021-12-09 NOTE — ED Notes (Signed)
Pt reports his blood sugar has been high x3 weeks.  Sts increased weakness, increased thirst, and polyuria. ?

## 2021-12-09 NOTE — ED Provider Notes (Signed)
Care assumed from PA [facility] at shift change, please see his note for full details, but in brief [name] is a 51 y.o. male with history of type 1 diabetes who presents with hyperglycemia.  Blood sugar has been elevated for the past 3 weeks. ? ?On arrival CBG of 390 with mild hyponatremia but no signs of DKA, patient without nausea, vomiting and has normal mental status. ? ?2 L fluid bolus ordered by prior care team, plan is to reassess blood glucose at this time, and as long as patient has improvement in CBG anticipate discharge home with close outpatient follow-up with PCP, endocrinology referral provided as well. ? ?BP (!) 125/91   Pulse 71   Temp 97.8 ?F (36.6 ?C) (Oral)   Resp 16   SpO2 94%  ? ? ?Procedures  ?Procedures ? ?ED Course / MDM  ?  ?Labs Reviewed  ?BASIC METABOLIC PANEL - Abnormal; Notable for the following components:  ?    Result Value  ? Sodium 132 (*)   ? Glucose, Bld 390 (*)   ? All other components within normal limits  ?URINALYSIS, ROUTINE W REFLEX MICROSCOPIC - Abnormal; Notable for the following components:  ? Glucose, UA >=500 (*)   ? All other components within normal limits  ?BLOOD GAS, VENOUS - Abnormal; Notable for the following components:  ? pO2, Ven 55 (*)   ? Bicarbonate 29.9 (*)   ? Acid-Base Excess 4.8 (*)   ? All other components within normal limits  ?URINALYSIS, MICROSCOPIC (REFLEX) - Abnormal; Notable for the following components:  ? Bacteria, UA RARE (*)   ? All other components within normal limits  ?CBG MONITORING, ED - Abnormal; Notable for the following components:  ? Glucose-Capillary 374 (*)   ? All other components within normal limits  ?CBG MONITORING, ED - Abnormal; Notable for the following components:  ? Glucose-Capillary 284 (*)   ? All other components within normal limits  ?CBC  ?BETA-HYDROXYBUTYRIC ACID  ?CBG MONITORING, ED  ? ? ?Patient's blood sugar is improving here after IV fluid bolus after 1.5 L blood sugar is down to 284.  Patient  reports that recently he has noted that when he eats things that are not superhigh and carbs his blood sugar seems to rise higher than it would before and then he reports even with giving additional insulin it takes much longer for it to come down.  Question whether patient may be developing some insulin resistance on top of his type 1 diabetes.  Feel he would benefit from outpatient follow-up with his PCP as well as close follow-up with endocrinology, he previously followed with someone in [place] but does not have a local endocrinologist he follows with regularly.  In the meantime recommended watching his carb intake closely. ? ?At this time feel he is appropriate for discharge home. ? ? ?Final diagnoses:  ?Hyperglycemia  ? ? ? ? ? ?  ?Dartha Lodge, [place] ?12/09/21 1957 ? ?  ?Gerhard Munch, MD ?12/09/21 2223 ? ?

## 2022-05-01 IMAGING — CR DG SHOULDER 2+V*R*
3 series · 3 of 3 positions shown · non-contrast
Comparison: None.

CLINICAL DATA: Progressive right shoulder pain

EXAM:
RIGHT SHOULDER - 2+ VIEW

[w shoulder external right]
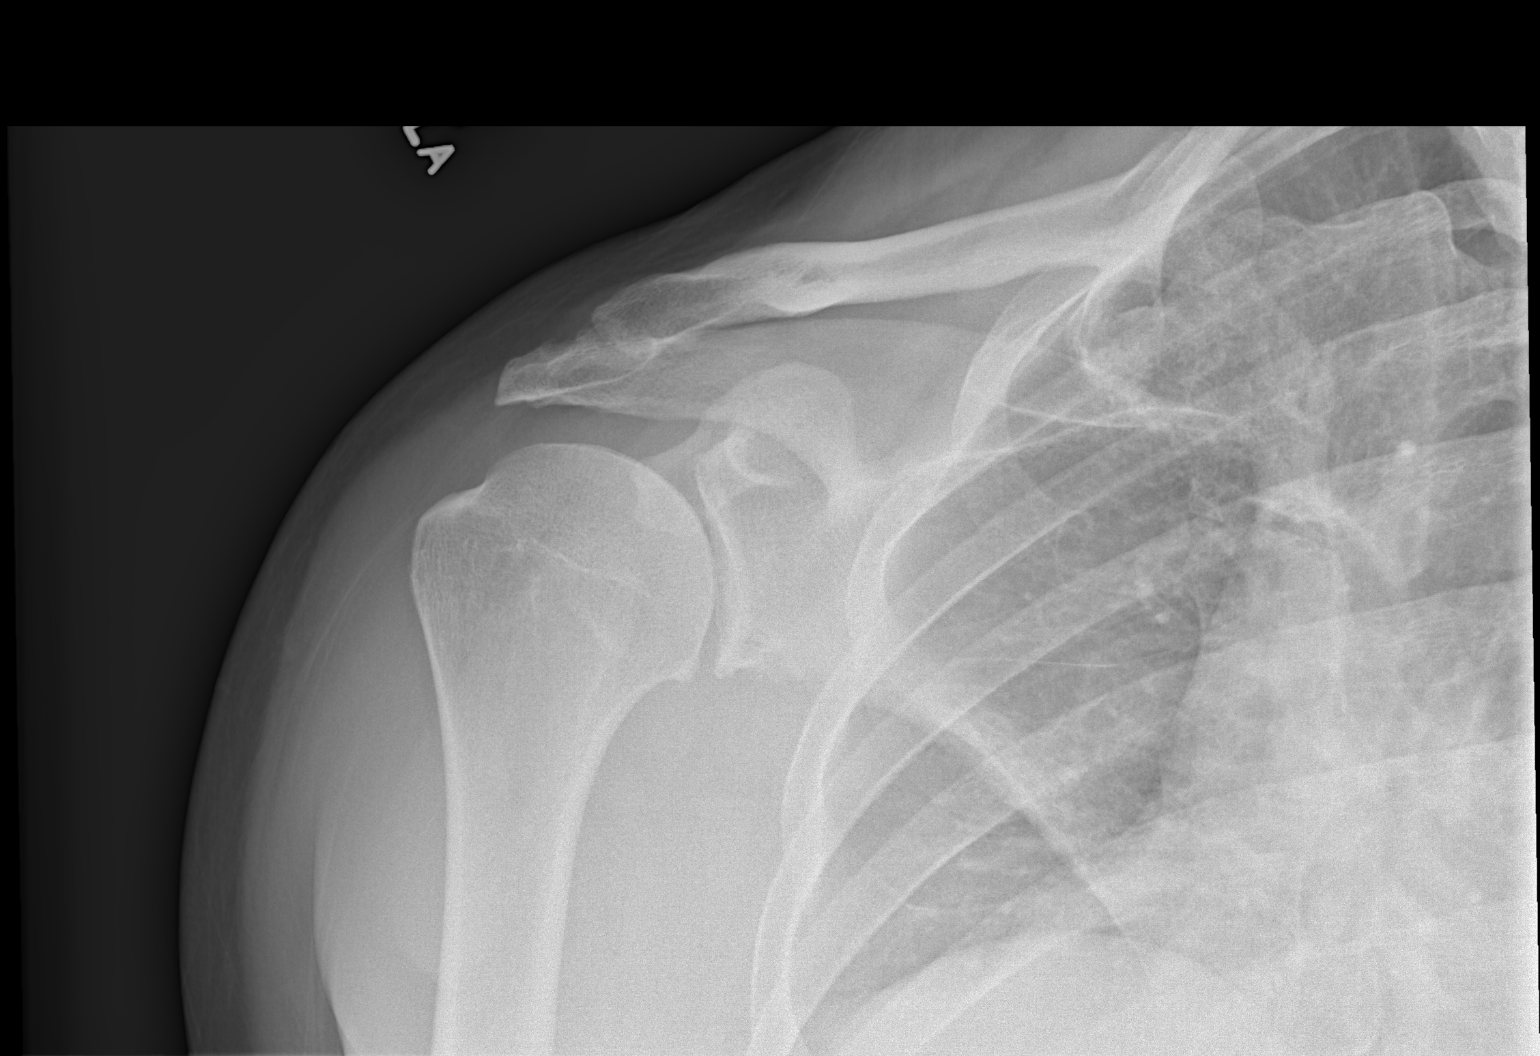

[w shoulder y-view right]
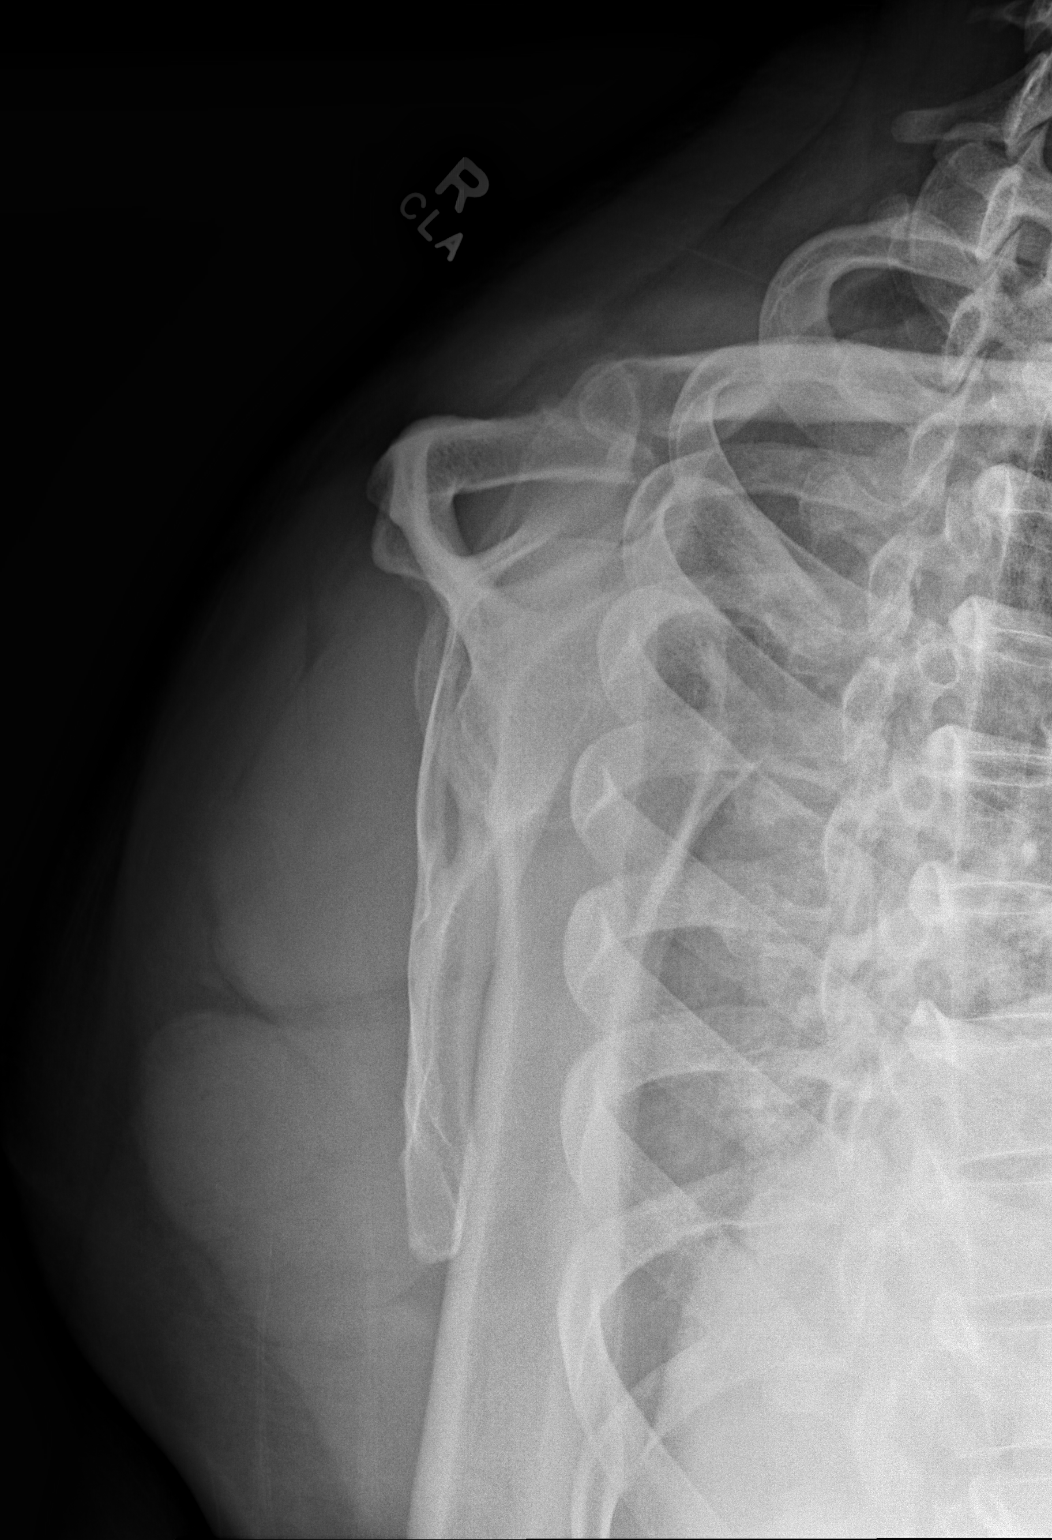

[x shoulder axillary right]
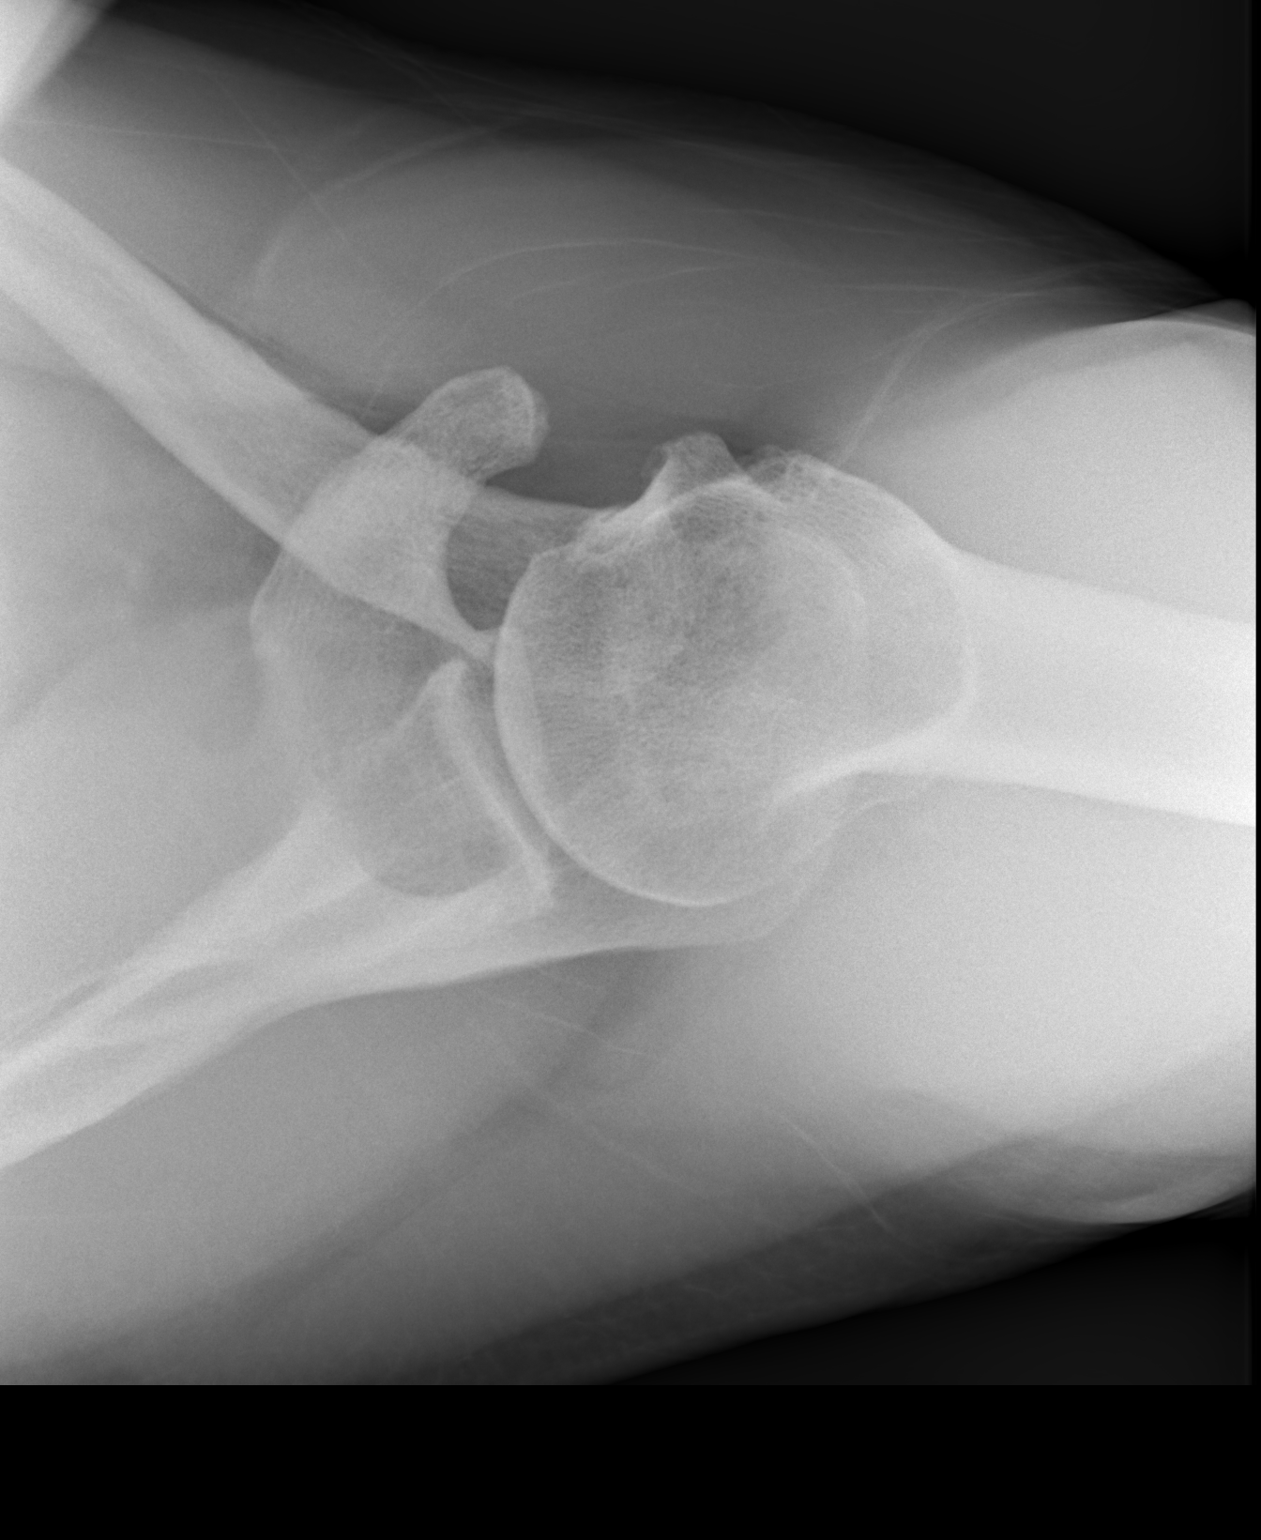

[3 of 3 positions shown; findings below may reference images not displayed]

FINDINGS: Frontal, transscapular, and axillary views of the right shoulder are
obtained. No fracture, subluxation, or dislocation. Mild to moderate
glenohumeral joint space narrowing and osteophyte formation. Mild
hypertrophic changes of the acromioclavicular joint. The right chest
is clear.
IMPRESSION: 1. Osteoarthritis of the glenohumeral and acromioclavicular joints.
No acute fracture.

## 2022-05-29 ENCOUNTER — Encounter: Payer: Self-pay | Admitting: Internal Medicine

## 2022-06-17 ENCOUNTER — Ambulatory Visit (AMBULATORY_SURGERY_CENTER): Payer: Federal, State, Local not specified - PPO | Admitting: *Deleted

## 2022-06-17 VITALS — Ht 73.0 in | Wt 229.4 lb

## 2022-06-17 DIAGNOSIS — Z1211 Encounter for screening for malignant neoplasm of colon: Secondary | ICD-10-CM

## 2022-06-17 MED ORDER — NA SULFATE-K SULFATE-MG SULF 17.5-3.13-1.6 GM/177ML PO SOLN: 1.0000 | Freq: Once | ORAL | 0 refills | Status: AC

## 2022-06-17 NOTE — Progress Notes (Signed)
No egg or soy allergy known to patient  No issues known to pt with past sedation with any surgeries or procedures Patient denies ever being told they had issues or difficulty with intubation  No FH of Malignant Hyperthermia Pt is not on diet pills Pt is not on home 02  Pt is not on blood thinners  Pt denies issues with constipation  No A fib or A flutter Have any cardiac testing pending--NO Pt instructed to use Single[employer]com or GoodRx for a price reduction on prep   

## 2022-07-08 ENCOUNTER — Encounter: Payer: Self-pay | Admitting: Internal Medicine

## 2022-07-08 ENCOUNTER — Ambulatory Visit (AMBULATORY_SURGERY_CENTER): Payer: Federal, State, Local not specified - PPO | Admitting: Internal Medicine

## 2022-07-08 VITALS — BP 118/72 | HR 92 | Temp 98.0°F | Resp 16 | Ht 73.0 in | Wt 229.4 lb

## 2022-07-08 DIAGNOSIS — Z1211 Encounter for screening for malignant neoplasm of colon: Secondary | ICD-10-CM | POA: Diagnosis not present

## 2022-07-08 MED ORDER — SODIUM CHLORIDE 0.9 % IV SOLN: 500.0000 mL | Freq: Once | INTRAVENOUS | Status: DC

## 2022-07-08 NOTE — Progress Notes (Signed)
VS completed by DT.  Pt's states no medical or surgical changes since previsit or office visit.  

## 2022-07-08 NOTE — Progress Notes (Signed)
HISTORY OF PRESENT ILLNESS:  [name] is a 51 y.o. male who presents today for routine screening colonoscopy.  No complaints  REVIEW OF SYSTEMS:  All non-GI ROS negative. Past Medical History:  Diagnosis Date   Diabetes mellitus without complication ([facility])    Thyroid disease     Past Surgical History:  Procedure Laterality Date   HEMATOMA EVACUATION     on L leg   NM THYROID STIM SUPRESS     "IN ACTIVE"    Social History [name]  reports that he has never smoked. He has never been exposed to tobacco smoke. He has never used smokeless tobacco. He reports that he does not drink alcohol and does not use drugs.  family history includes Diabetes in his maternal grandmother and mother.  No Known Allergies     PHYSICAL EXAMINATION: Vital signs: BP 130/81   Pulse 83   Temp 98 F (36.7 C) (Temporal)   Resp 11   Ht 6\' 1"  (1.854 m)   Wt 229 lb 6.4 oz (104.1 kg)   SpO2 98%   BMI 30.27 kg/m  General: Well-developed, well-nourished, no acute distress HEENT: Sclerae are anicteric, conjunctiva pink. Oral mucosa intact Lungs: Clear Heart: Regular Abdomen: soft, nontender, nondistended, no obvious ascites, no peritoneal signs, normal bowel sounds. No organomegaly. Extremities: No edema Psychiatric: alert and oriented x3. Cooperative      ASSESSMENT:  Colon cancer screening   PLAN:  Screening colonoscopy

## 2022-07-08 NOTE — Op Note (Signed)
[name] Procedure Date: 07/08/2022 11:10 AM MRN: [id number] Endoscopist: Docia Chuck. Henrene Pastor , MD Age: 51 Referring MD:  Date of Birth: July 21, 1971 Gender: Male Account #: [id number] Procedure:                Colonoscopy Indications:              Screening for colorectal malignant neoplasm Medicines:                Monitored Anesthesia Care Procedure:                Pre-Anesthesia Assessment:                           - Prior to the procedure, a History and Physical                            was performed, and patient medications and                            allergies were reviewed. The patient's tolerance of                            previous anesthesia was also reviewed. The risks                            and benefits of the procedure and the sedation                            options and risks were discussed with the patient.                            All questions were answered, and informed consent                            was obtained. Prior Anticoagulants: The patient has                            taken no previous anticoagulant or antiplatelet                            agents. ASA Grade Assessment: II - A patient with                            mild systemic disease. After reviewing the risks                            and benefits, the patient was deemed in                            satisfactory condition to undergo the procedure.                           After obtaining informed consent, the colonoscope  was passed under direct vision. Throughout the                            procedure, the patient's blood pressure, pulse, and                            oxygen saturations were monitored continuously. The                            Olympus CF-HQ190L [id number]) Colonoscope was                            introduced through the anus and advanced to the the                            cecum, identified  by appendiceal orifice and                            ileocecal valve. The ileocecal valve, appendiceal                            orifice, and rectum were photographed. The quality                            of the bowel preparation was excellent. The                            colonoscopy was performed without difficulty. The                            patient tolerated the procedure well. The bowel                            preparation used was SUPREP via split dose                            instruction. Scope In: 11:17:10 AM Scope Out: 11:29:12 AM Scope Withdrawal Time: 0 hours 9 minutes 5 seconds  Total Procedure Duration: 0 hours 12 minutes 2 seconds  Findings:                 The entire examined colon appeared normal on direct                            and retroflexion views. Complications:            No immediate complications. Estimated blood loss:                            None. Estimated Blood Loss:     Estimated blood loss: none. Impression:               - The entire examined colon is normal on direct and                            retroflexion views.                           -  No specimens collected. Recommendation:           - Repeat colonoscopy in 10 years for screening                            purposes.                           - Patient has a contact number available for                            emergencies. The signs and symptoms of potential                            delayed complications were discussed with the                            patient. Return to normal activities tomorrow.                            Written discharge instructions were provided to the                            patient.                           - Resume previous diet.                           - Continue present medications. Docia Chuck. Henrene Pastor, MD 07/08/2022 11:32:41 AM This report has been signed electronically.

## 2022-07-08 NOTE — Progress Notes (Signed)
Report to PACU, RN, vss, BBS= Clear.  

## 2022-07-08 NOTE — Patient Instructions (Signed)
-   Repeat colonoscopy in 10 years for screening purposes. - Patient has a contact number available for emergencies. The signs and symptoms of potential delayed complications were discussed with the patient. Return to normal activities tomorrow. Written discharge instructions were provided to the patient. - Resume previous diet. - Continue present medications.  YOU HAD AN ENDOSCOPIC PROCEDURE TODAY AT THE [facility]:   Refer to the procedure report that was given to you for any specific questions about what was found during the examination.  If the procedure report does not answer your questions, please call your gastroenterologist to clarify.  If you requested that your care partner not be given the details of your procedure findings, then the procedure report has been included in a sealed envelope for you to review at your convenience later.  YOU SHOULD EXPECT: Some feelings of bloating in the abdomen. Passage of more gas than usual.  Walking can help get rid of the air that was put into your GI tract during the procedure and reduce the bloating. If you had a lower endoscopy (such as a colonoscopy or flexible sigmoidoscopy) you may notice spotting of blood in your stool or on the toilet paper. If you underwent a bowel prep for your procedure, you may not have a normal bowel movement for a few days.  Please Note:  You might notice some irritation and congestion in your nose or some drainage.  This is from the oxygen used during your procedure.  There is no need for concern and it should clear up in a day or so.  SYMPTOMS TO REPORT IMMEDIATELY:  Following lower endoscopy (colonoscopy or flexible sigmoidoscopy):  Excessive amounts of blood in the stool  Significant tenderness or worsening of abdominal pains  Swelling of the abdomen that is new, acute  Fever of 100F or higher  For urgent or emergent issues, a gastroenterologist can be reached at any hour by calling [phone]. Do  not use MyChart messaging for urgent concerns.    DIET:  We do recommend a small meal at first, but then you may proceed to your regular diet.  Drink plenty of fluids but you should avoid alcoholic beverages for 24 hours.  ACTIVITY:  You should plan to take it easy for the rest of today and you should NOT DRIVE or use heavy machinery until tomorrow (because of the sedation medicines used during the test).    FOLLOW UP: Our staff will call the number listed on your records the next business day following your procedure.  We will call around 7:15- 8:00 am to check on you and address any questions or concerns that you may have regarding the information given to you following your procedure. If we do not reach you, we will leave a message.     If any biopsies were taken you will be contacted by phone or by letter within the next 1-3 weeks.  Please call us at [phone] if you have not heard about the biopsies in 3 weeks.    SIGNATURES/CONFIDENTIALITY: You and/or your care partner have signed paperwork which will be entered into your electronic medical record.  These signatures attest to the fact that that the information above on your After Visit Summary has been reviewed and is understood.  Full responsibility of the confidentiality of this discharge information lies with you and/or your care-partner.  

## 2022-07-09 ENCOUNTER — Telehealth: Payer: Self-pay | Admitting: *Deleted

## 2022-07-09 NOTE — Telephone Encounter (Signed)
Follow up all attempt.  LVM to call if any questions or concerns. 

## 2022-07-17 ENCOUNTER — Encounter (HOSPITAL_COMMUNITY): Payer: Self-pay

## 2022-07-17 ENCOUNTER — Emergency Department (HOSPITAL_COMMUNITY)
Admission: EM | Admit: 2022-07-17 | Discharge: 2022-07-17 | Discharge status: Against Medical Advice | Payer: Federal, State, Local not specified - PPO | Attending: Emergency Medicine | Admitting: Emergency Medicine

## 2022-07-17 ENCOUNTER — Other Ambulatory Visit: Payer: Self-pay

## 2022-07-17 DIAGNOSIS — Z5321 Procedure and treatment not carried out due to patient leaving prior to being seen by health care provider: Secondary | ICD-10-CM | POA: Diagnosis not present

## 2022-07-17 DIAGNOSIS — E162 Hypoglycemia, unspecified: Secondary | ICD-10-CM | POA: Diagnosis not present

## 2022-07-17 DIAGNOSIS — I10 Essential (primary) hypertension: Secondary | ICD-10-CM | POA: Diagnosis not present

## 2022-07-17 DIAGNOSIS — E161 Other hypoglycemia: Secondary | ICD-10-CM | POA: Diagnosis not present

## 2022-07-17 DIAGNOSIS — R456 Violent behavior: Secondary | ICD-10-CM | POA: Diagnosis not present

## 2022-07-17 LAB — CBC WITH DIFFERENTIAL/PLATELET
Abs Immature Granulocytes: 0.02 10*3/uL (ref 0.00–0.07)
Basophils Absolute: 0 10*3/uL (ref 0.0–0.1)
Basophils Relative: 0 %
Eosinophils Absolute: 0.2 10*3/uL (ref 0.0–0.5)
Eosinophils Relative: 2 %
HCT: 43.7 % (ref 39.0–52.0)
Hemoglobin: 14.2 g/dL (ref 13.0–17.0)
Immature Granulocytes: 0 %
Lymphocytes Relative: 19 %
Lymphs Abs: 1.7 10*3/uL (ref 0.7–4.0)
MCH: 27.3 pg (ref 26.0–34.0)
MCHC: 32.5 g/dL (ref 30.0–36.0)
MCV: 83.9 fL (ref 80.0–100.0)
Monocytes Absolute: 0.6 10*3/uL (ref 0.1–1.0)
Monocytes Relative: 7 %
Neutro Abs: 6.5 10*3/uL (ref 1.7–7.7)
Neutrophils Relative %: 72 %
Platelets: 181 10*3/uL (ref 150–400)
RBC: 5.21 MIL/uL (ref 4.22–5.81)
RDW: 14.8 % (ref 11.5–15.5)
WBC: 9 10*3/uL (ref 4.0–10.5)
nRBC: 0 % (ref 0.0–0.2)

## 2022-07-17 LAB — BASIC METABOLIC PANEL
Anion gap: 12 (ref 5–15)
BUN: 18 mg/dL (ref 6–20)
CO2: 25 mmol/L (ref 22–32)
Calcium: 9.6 mg/dL (ref 8.9–10.3)
Chloride: 105 mmol/L (ref 98–111)
Creatinine, Ser: 1.25 mg/dL — ABNORMAL HIGH (ref 0.61–1.24)
GFR, Estimated: >60 mL/min (ref >60)
Glucose, Bld: 92 mg/dL (ref 70–99)
Potassium: 3.5 mmol/L (ref 3.5–5.1)
Sodium: 142 mmol/L (ref 135–145)

## 2022-07-17 LAB — CBG MONITORING, ED
Glucose-Capillary: 104 mg/dL — ABNORMAL HIGH (ref 70–99)
Glucose-Capillary: 87 mg/dL (ref 70–99)

## 2022-07-17 NOTE — ED Triage Notes (Signed)
Initial glucose was 31 and repeat after glucose 88.  Patient was combative during low cbg.  Patient alert and oriented at this time and asymptomatic.

## 2022-07-17 NOTE — ED Notes (Signed)
Pt notified staff that he is leaving because he now feels fine

## 2022-07-29 DIAGNOSIS — E6609 Other obesity due to excess calories: Secondary | ICD-10-CM | POA: Diagnosis not present

## 2022-07-29 DIAGNOSIS — E1169 Type 2 diabetes mellitus with other specified complication: Secondary | ICD-10-CM | POA: Diagnosis not present

## 2022-07-29 DIAGNOSIS — E0590 Thyrotoxicosis, unspecified without thyrotoxic crisis or storm: Secondary | ICD-10-CM | POA: Diagnosis not present

## 2022-07-29 DIAGNOSIS — E1069 Type 1 diabetes mellitus with other specified complication: Secondary | ICD-10-CM | POA: Diagnosis not present

## 2022-07-29 DIAGNOSIS — E559 Vitamin D deficiency, unspecified: Secondary | ICD-10-CM | POA: Diagnosis not present

## 2022-08-13 DIAGNOSIS — I959 Hypotension, unspecified: Secondary | ICD-10-CM | POA: Diagnosis not present

## 2022-08-23 DIAGNOSIS — E161 Other hypoglycemia: Secondary | ICD-10-CM | POA: Diagnosis not present

## 2022-08-23 DIAGNOSIS — R404 Transient alteration of awareness: Secondary | ICD-10-CM | POA: Diagnosis not present

## 2022-08-23 DIAGNOSIS — E162 Hypoglycemia, unspecified: Secondary | ICD-10-CM | POA: Diagnosis not present

## 2022-08-23 DIAGNOSIS — R0689 Other abnormalities of breathing: Secondary | ICD-10-CM | POA: Diagnosis not present

## 2022-08-30 ENCOUNTER — Encounter (HOSPITAL_COMMUNITY): Payer: Self-pay

## 2022-08-30 ENCOUNTER — Emergency Department (HOSPITAL_COMMUNITY)
Admission: EM | Admit: 2022-08-30 | Discharge: 2022-08-30 | Disposition: A | Discharge status: Home / Self Care | Payer: Federal, State, Local not specified - PPO | Attending: Emergency Medicine | Admitting: Emergency Medicine

## 2022-08-30 DIAGNOSIS — E039 Hypothyroidism, unspecified: Secondary | ICD-10-CM | POA: Insufficient documentation

## 2022-08-30 DIAGNOSIS — Z7984 Long term (current) use of oral hypoglycemic drugs: Secondary | ICD-10-CM | POA: Insufficient documentation

## 2022-08-30 DIAGNOSIS — Z794 Long term (current) use of insulin: Secondary | ICD-10-CM | POA: Insufficient documentation

## 2022-08-30 DIAGNOSIS — E109 Type 1 diabetes mellitus without complications: Secondary | ICD-10-CM | POA: Diagnosis not present

## 2022-08-30 DIAGNOSIS — E161 Other hypoglycemia: Secondary | ICD-10-CM | POA: Diagnosis not present

## 2022-08-30 DIAGNOSIS — R001 Bradycardia, unspecified: Secondary | ICD-10-CM | POA: Diagnosis not present

## 2022-08-30 DIAGNOSIS — E162 Hypoglycemia, unspecified: Secondary | ICD-10-CM | POA: Insufficient documentation

## 2022-08-30 DIAGNOSIS — I959 Hypotension, unspecified: Secondary | ICD-10-CM | POA: Diagnosis not present

## 2022-08-30 DIAGNOSIS — E11649 Type 2 diabetes mellitus with hypoglycemia without coma: Secondary | ICD-10-CM | POA: Diagnosis not present

## 2022-08-30 LAB — CBC WITH DIFFERENTIAL/PLATELET
Abs Immature Granulocytes: 0.05 10*3/uL (ref 0.00–0.07)
Basophils Absolute: 0.1 10*3/uL (ref 0.0–0.1)
Basophils Relative: 0 %
Eosinophils Absolute: 0.1 10*3/uL (ref 0.0–0.5)
Eosinophils Relative: 1 %
HCT: 45.3 % (ref 39.0–52.0)
Hemoglobin: 13.9 g/dL (ref 13.0–17.0)
Immature Granulocytes: 0 %
Lymphocytes Relative: 12 %
Lymphs Abs: 1.5 10*3/uL (ref 0.7–4.0)
MCH: 27.1 pg (ref 26.0–34.0)
MCHC: 30.7 g/dL (ref 30.0–36.0)
MCV: 88.3 fL (ref 80.0–100.0)
Monocytes Absolute: 0.6 10*3/uL (ref 0.1–1.0)
Monocytes Relative: 5 %
Neutro Abs: 9.9 10*3/uL — ABNORMAL HIGH (ref 1.7–7.7)
Neutrophils Relative %: 82 %
Platelets: 197 10*3/uL (ref 150–400)
RBC: 5.13 MIL/uL (ref 4.22–5.81)
RDW: 15.3 % (ref 11.5–15.5)
WBC: 12.1 10*3/uL — ABNORMAL HIGH (ref 4.0–10.5)
nRBC: 0 % (ref 0.0–0.2)

## 2022-08-30 LAB — COMPREHENSIVE METABOLIC PANEL
ALT: 39 U/L (ref 0–44)
AST: 31 U/L (ref 15–41)
Albumin: 3.5 g/dL (ref 3.5–5.0)
Alkaline Phosphatase: 49 U/L (ref 38–126)
Anion gap: 15 (ref 5–15)
BUN: 12 mg/dL (ref 6–20)
CO2: 21 mmol/L — ABNORMAL LOW (ref 22–32)
Calcium: 9.6 mg/dL (ref 8.9–10.3)
Chloride: 106 mmol/L (ref 98–111)
Creatinine, Ser: 1.04 mg/dL (ref 0.61–1.24)
GFR, Estimated: >60 mL/min (ref >60)
Glucose, Bld: 80 mg/dL (ref 70–99)
Potassium: 4 mmol/L (ref 3.5–5.1)
Sodium: 142 mmol/L (ref 135–145)
Total Bilirubin: 0.5 mg/dL (ref 0.3–1.2)
Total Protein: 6.7 g/dL (ref 6.5–8.1)

## 2022-08-30 LAB — CBG MONITORING, ED
Glucose-Capillary: 146 mg/dL — ABNORMAL HIGH (ref 70–99)
Glucose-Capillary: 66 mg/dL — ABNORMAL LOW (ref 70–99)
Glucose-Capillary: 87 mg/dL (ref 70–99)

## 2022-08-30 NOTE — Discharge Instructions (Addendum)
We evaluated you in the emergency apartment for your low blood sugar.  Your low blood sugar was probably a combination of taking your normal insulin without eating breakfast.  Your mild upper respiratory tract infection symptoms may also have contributed to your low blood sugar.  Please follow-up closely with your primary doctor for management of your diabetes.  Please continue to wear your freestyle libre continuous glucose monitor to help prevent further episodes.

## 2022-08-30 NOTE — Inpatient Diabetes Management (Addendum)
Inpatient Diabetes Program Recommendations  AACE/ADA: New Consensus Statement on Inpatient Glycemic Control (2015)  Target Ranges:  Prepandial:   less than 140 mg/dL      Peak postprandial:   less than 180 mg/dL (1-2 hours)      Critically ill patients:  140 - 180 mg/dL   Lab Results  Component Value Date   GLUCAP 87 08/30/2022   HGBA1C 8.0 (A) 11/11/2018    Review of Glycemic Control  Latest Reference Range & Units 08/30/22 11:46 08/30/22 14:09 08/30/22 14:40  Glucose-Capillary 70 - 99 mg/dL 938 (H) 66 (L) 87  (H): Data is abnormally high (L): Data is abnormally low Diabetes history: Type 1 DM Outpatient Diabetes medications: Basaglar 28 units QHS, Jardiance 25 units QD, Novolog 1: 10 CHO Current orders for Inpatient glycemic control: none  Inpatient Diabetes Program Recommendations:    Consider adding A1C? Once CBGs >120 mg/dL consider adding Novolog 0-6 units Q4H and Semglee 20 units QHS.  Spoke with patient regarding hypoglycemia. Patient reports that he had taken his Novolog and was delayed in eating. This type of event is unusual for him.  Noted he had seen Dr Lonzo Cloud in 2020 but has not followed up since. Encouraged to find another endocrinologist to help assist. Referral has already been placed by PCP.  Patient reports that his blood sugars have been elevated recently and he feels that he is becoming more resistant and has been giving himself more insulin that in prior years. Wears Freestyle libre and received new prescription yesterday, unfortunately had not applied and was checking via fingerstick. Encouraged to reapply when he gets home.   Discussed the importance of ensuring that he can see food when taking Novolog to reduce likelihood of hypoglycemia as well as taking insulin as prescribed. Verbalizes understanding.  No further questions at this time.   Thanks, Lujean Rave, MSN, RNC-OB Diabetes Coordinator [phone] (8a-5p)

## 2022-08-30 NOTE — ED Notes (Signed)
Gave patient some orange juice with some sugar. He refused [place] sandwich and graham crackers.
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
## 2022-09-02 DIAGNOSIS — E161 Other hypoglycemia: Secondary | ICD-10-CM | POA: Diagnosis not present

## 2022-09-02 DIAGNOSIS — E1069 Type 1 diabetes mellitus with other specified complication: Secondary | ICD-10-CM | POA: Diagnosis not present
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
## 2022-09-20 DIAGNOSIS — E161 Other hypoglycemia: Secondary | ICD-10-CM | POA: Diagnosis not present

## 2022-10-21 DIAGNOSIS — E0590 Thyrotoxicosis, unspecified without thyrotoxic crisis or storm: Secondary | ICD-10-CM | POA: Diagnosis not present

## 2022-10-21 DIAGNOSIS — E1069 Type 1 diabetes mellitus with other specified complication: Secondary | ICD-10-CM | POA: Diagnosis not present

## 2022-10-21 DIAGNOSIS — E161 Other hypoglycemia: Secondary | ICD-10-CM | POA: Diagnosis not present

## 2022-10-21 DIAGNOSIS — E559 Vitamin D deficiency, unspecified: Secondary | ICD-10-CM | POA: Diagnosis not present

## 2022-11-29 DIAGNOSIS — E161 Other hypoglycemia: Secondary | ICD-10-CM | POA: Diagnosis not present

## 2022-11-29 DIAGNOSIS — R569 Unspecified convulsions: Secondary | ICD-10-CM | POA: Diagnosis not present

## 2022-11-29 DIAGNOSIS — E162 Hypoglycemia, unspecified: Secondary | ICD-10-CM | POA: Diagnosis not present

## 2022-11-29 DIAGNOSIS — R404 Transient alteration of awareness: Secondary | ICD-10-CM | POA: Diagnosis not present

## 2023-02-10 DIAGNOSIS — E161 Other hypoglycemia: Secondary | ICD-10-CM | POA: Diagnosis not present

## 2023-02-10 DIAGNOSIS — E559 Vitamin D deficiency, unspecified: Secondary | ICD-10-CM | POA: Diagnosis not present

## 2023-02-10 DIAGNOSIS — E0590 Thyrotoxicosis, unspecified without thyrotoxic crisis or storm: Secondary | ICD-10-CM | POA: Diagnosis not present

## 2023-02-10 DIAGNOSIS — Z20822 Contact with and (suspected) exposure to covid-19: Secondary | ICD-10-CM | POA: Diagnosis not present

## 2023-02-10 DIAGNOSIS — E6609 Other obesity due to excess calories: Secondary | ICD-10-CM | POA: Diagnosis not present

## 2023-02-10 DIAGNOSIS — I10 Essential (primary) hypertension: Secondary | ICD-10-CM | POA: Diagnosis not present

## 2023-02-10 DIAGNOSIS — E1069 Type 1 diabetes mellitus with other specified complication: Secondary | ICD-10-CM | POA: Diagnosis not present

## 2023-02-12 DIAGNOSIS — E161 Other hypoglycemia: Secondary | ICD-10-CM | POA: Diagnosis not present

## 2023-02-12 DIAGNOSIS — R404 Transient alteration of awareness: Secondary | ICD-10-CM | POA: Diagnosis not present

## 2023-02-12 DIAGNOSIS — R6889 Other general symptoms and signs: Secondary | ICD-10-CM | POA: Diagnosis not present

## 2023-03-04 DIAGNOSIS — E109 Type 1 diabetes mellitus without complications: Secondary | ICD-10-CM | POA: Diagnosis not present

## 2023-03-04 DIAGNOSIS — M6208 Separation of muscle (nontraumatic), other site: Secondary | ICD-10-CM | POA: Diagnosis not present

## 2023-05-31 ENCOUNTER — Emergency Department (HOSPITAL_COMMUNITY)
Admission: EM | Admit: 2023-05-31 | Discharge: 2023-05-31 | Disposition: A | Discharge status: Home / Self Care | Payer: Federal, State, Local not specified - PPO | Source: Home / Self Care | Attending: Emergency Medicine | Admitting: Emergency Medicine

## 2023-05-31 ENCOUNTER — Emergency Department (HOSPITAL_COMMUNITY): Payer: Federal, State, Local not specified - PPO

## 2023-05-31 ENCOUNTER — Other Ambulatory Visit: Payer: Self-pay

## 2023-05-31 DIAGNOSIS — E11649 Type 2 diabetes mellitus with hypoglycemia without coma: Secondary | ICD-10-CM | POA: Diagnosis not present

## 2023-05-31 DIAGNOSIS — S3993XA Unspecified injury of pelvis, initial encounter: Secondary | ICD-10-CM | POA: Diagnosis not present

## 2023-05-31 DIAGNOSIS — Y92410 Unspecified street and highway as the place of occurrence of the external cause: Secondary | ICD-10-CM | POA: Insufficient documentation

## 2023-05-31 DIAGNOSIS — M47812 Spondylosis without myelopathy or radiculopathy, cervical region: Secondary | ICD-10-CM | POA: Diagnosis not present

## 2023-05-31 DIAGNOSIS — T148XXA Other injury of unspecified body region, initial encounter: Secondary | ICD-10-CM

## 2023-05-31 DIAGNOSIS — K429 Umbilical hernia without obstruction or gangrene: Secondary | ICD-10-CM | POA: Diagnosis not present

## 2023-05-31 DIAGNOSIS — S0083XA Contusion of other part of head, initial encounter: Secondary | ICD-10-CM | POA: Diagnosis not present

## 2023-05-31 DIAGNOSIS — Z041 Encounter for examination and observation following transport accident: Secondary | ICD-10-CM | POA: Diagnosis not present

## 2023-05-31 DIAGNOSIS — E161 Other hypoglycemia: Secondary | ICD-10-CM | POA: Diagnosis not present

## 2023-05-31 DIAGNOSIS — S199XXA Unspecified injury of neck, initial encounter: Secondary | ICD-10-CM | POA: Diagnosis not present

## 2023-05-31 DIAGNOSIS — Z7984 Long term (current) use of oral hypoglycemic drugs: Secondary | ICD-10-CM | POA: Insufficient documentation

## 2023-05-31 DIAGNOSIS — E162 Hypoglycemia, unspecified: Secondary | ICD-10-CM

## 2023-05-31 DIAGNOSIS — M542 Cervicalgia: Secondary | ICD-10-CM | POA: Diagnosis not present

## 2023-05-31 DIAGNOSIS — Z794 Long term (current) use of insulin: Secondary | ICD-10-CM | POA: Insufficient documentation

## 2023-05-31 DIAGNOSIS — R9431 Abnormal electrocardiogram [ECG] [EKG]: Secondary | ICD-10-CM | POA: Diagnosis not present

## 2023-05-31 DIAGNOSIS — R0789 Other chest pain: Secondary | ICD-10-CM | POA: Diagnosis not present

## 2023-05-31 DIAGNOSIS — S0990XA Unspecified injury of head, initial encounter: Secondary | ICD-10-CM | POA: Diagnosis not present

## 2023-05-31 DIAGNOSIS — L539 Erythematous condition, unspecified: Secondary | ICD-10-CM | POA: Insufficient documentation

## 2023-05-31 DIAGNOSIS — S3991XA Unspecified injury of abdomen, initial encounter: Secondary | ICD-10-CM | POA: Diagnosis not present

## 2023-05-31 DIAGNOSIS — R918 Other nonspecific abnormal finding of lung field: Secondary | ICD-10-CM | POA: Diagnosis not present

## 2023-05-31 DIAGNOSIS — S299XXA Unspecified injury of thorax, initial encounter: Secondary | ICD-10-CM | POA: Diagnosis not present

## 2023-05-31 LAB — I-STAT CHEM 8, ED
BUN: 14 mg/dL (ref 6–20)
Calcium, Ion: 1.2 mmol/L (ref 1.15–1.40)
Chloride: 101 mmol/L (ref 98–111)
Creatinine, Ser: 1.3 mg/dL — ABNORMAL HIGH (ref 0.61–1.24)
Glucose, Bld: 152 mg/dL — ABNORMAL HIGH (ref 70–99)
HCT: 42 % (ref 39.0–52.0)
Hemoglobin: 14.3 g/dL (ref 13.0–17.0)
Potassium: 4.5 mmol/L (ref 3.5–5.1)
Sodium: 139 mmol/L (ref 135–145)
TCO2: 26 mmol/L (ref 22–32)

## 2023-05-31 LAB — COMPREHENSIVE METABOLIC PANEL
ALT: 29 U/L (ref 0–44)
AST: 36 U/L (ref 15–41)
Albumin: 3.7 g/dL (ref 3.5–5.0)
Alkaline Phosphatase: 55 U/L (ref 38–126)
Anion gap: 8 (ref 5–15)
BUN: 12 mg/dL (ref 6–20)
CO2: 26 mmol/L (ref 22–32)
Calcium: 8.9 mg/dL (ref 8.9–10.3)
Chloride: 101 mmol/L (ref 98–111)
Creatinine, Ser: 1.36 mg/dL — ABNORMAL HIGH (ref 0.61–1.24)
GFR, Estimated: >60 mL/min (ref >60)
Glucose, Bld: 150 mg/dL — ABNORMAL HIGH (ref 70–99)
Potassium: 4.6 mmol/L (ref 3.5–5.1)
Sodium: 135 mmol/L (ref 135–145)
Total Bilirubin: 0.5 mg/dL (ref 0.3–1.2)
Total Protein: 7.1 g/dL (ref 6.5–8.1)

## 2023-05-31 LAB — CBC
HCT: 39.6 % (ref 39.0–52.0)
Hemoglobin: 12.8 g/dL — ABNORMAL LOW (ref 13.0–17.0)
MCH: 28.3 pg (ref 26.0–34.0)
MCHC: 32.3 g/dL (ref 30.0–36.0)
MCV: 87.6 fL (ref 80.0–100.0)
Platelets: 197 10*3/uL (ref 150–400)
RBC: 4.52 MIL/uL (ref 4.22–5.81)
RDW: 13.6 % (ref 11.5–15.5)
WBC: 11.5 10*3/uL — ABNORMAL HIGH (ref 4.0–10.5)
nRBC: 0 % (ref 0.0–0.2)

## 2023-05-31 LAB — CBG MONITORING, ED
Glucose-Capillary: 36 mg/dL — CL (ref 70–99)
Glucose-Capillary: 87 mg/dL (ref 70–99)
Glucose-Capillary: 121 mg/dL — ABNORMAL HIGH (ref 70–99)

## 2023-05-31 MED ORDER — OXYCODONE-ACETAMINOPHEN 5-325 MG PO TABS
1.0000 | ORAL_TABLET | Freq: Once | ORAL | Status: AC
  Administered 2023-05-31: 1 via ORAL
  Filled 2023-05-31: qty 1

## 2023-05-31 MED ORDER — IOHEXOL 350 MG/ML SOLN
100.0000 mL | Freq: Once | INTRAVENOUS | Status: AC | PRN
  Administered 2023-05-31: 100 mL via INTRAVENOUS

## 2023-05-31 MED ORDER — DEXTROSE 50 % IV SOLN
25.0000 mL | Freq: Once | INTRAVENOUS | Status: AC
  Administered 2023-05-31: 25 mL via INTRAVENOUS

## 2023-05-31 MED ORDER — DEXTROSE 50 % IV SOLN
50.0000 mL | Freq: Once | INTRAVENOUS | Status: DC
  Filled 2023-05-31: qty 50

## 2023-05-31 MED ORDER — ACETAMINOPHEN 500 MG PO TABS: 500.0000 mg | ORAL_TABLET | Freq: Four times a day (QID) | ORAL | 0 refills | Status: AC | PRN

## 2023-05-31 MED ORDER — MORPHINE SULFATE (PF) 4 MG/ML IV SOLN
4.0000 mg | Freq: Once | INTRAVENOUS | Status: AC
  Administered 2023-05-31: 4 mg via INTRAVENOUS
  Filled 2023-05-31: qty 1

## 2023-05-31 MED ORDER — IBUPROFEN 600 MG PO TABS: 600.0000 mg | ORAL_TABLET | Freq: Four times a day (QID) | ORAL | 0 refills | Status: AC | PRN
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
## 2023-05-31 NOTE — ED Notes (Signed)
Patient transported to X-ray 

## 2023-05-31 NOTE — ED Notes (Signed)
Pt provided to cups of orange juice.
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
## 2023-05-31 NOTE — ED Notes (Signed)
Pt states he ate around 2:30 and takes his insulin based off his carb count.

## 2023-05-31 NOTE — ED Notes (Signed)
Pt says his CBG monitored 340 when he checked it at work.

## 2023-05-31 NOTE — ED Notes (Signed)
Patient transported to CT 
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
## 2023-05-31 NOTE — ED Notes (Signed)
IV team at bedside 

## 2023-05-31 NOTE — ED Notes (Signed)
Secondary RN attempted to place an IV twice without success. RN placed IV team consult and EDP aware

## 2023-05-31 NOTE — ED Notes (Signed)
RN contacted pt brother per request to come to hospital.

## 2023-05-31 NOTE — ED Notes (Signed)
RN notified EDP about pt CBG. Awaiting additional orders to hold D50 or give after IV placement.

## 2023-05-31 NOTE — ED Notes (Signed)
EDP recommend pt to be NPO and to administer half amp of D50.

## 2023-06-09 DIAGNOSIS — E559 Vitamin D deficiency, unspecified: Secondary | ICD-10-CM | POA: Diagnosis not present

## 2023-06-09 DIAGNOSIS — I10 Essential (primary) hypertension: Secondary | ICD-10-CM | POA: Diagnosis not present

## 2023-06-09 DIAGNOSIS — E7800 Pure hypercholesterolemia, unspecified: Secondary | ICD-10-CM | POA: Diagnosis not present

## 2023-06-09 DIAGNOSIS — E1069 Type 1 diabetes mellitus with other specified complication: Secondary | ICD-10-CM | POA: Diagnosis not present

## 2023-06-09 DIAGNOSIS — E6609 Other obesity due to excess calories: Secondary | ICD-10-CM | POA: Diagnosis not present

## 2023-07-08 DIAGNOSIS — E161 Other hypoglycemia: Secondary | ICD-10-CM | POA: Diagnosis not present

## 2023-07-08 DIAGNOSIS — E0590 Thyrotoxicosis, unspecified without thyrotoxic crisis or storm: Secondary | ICD-10-CM | POA: Diagnosis not present

## 2023-07-08 DIAGNOSIS — E1069 Type 1 diabetes mellitus with other specified complication: Secondary | ICD-10-CM | POA: Diagnosis not present

## 2023-09-09 ENCOUNTER — Encounter: Payer: Self-pay | Admitting: Internal Medicine

## 2023-09-09 ENCOUNTER — Ambulatory Visit (INDEPENDENT_AMBULATORY_CARE_PROVIDER_SITE_OTHER): Payer: Federal, State, Local not specified - PPO | Admitting: Internal Medicine

## 2023-09-09 VITALS — BP 134/86 | HR 76 | Ht 73.0 in | Wt 237.0 lb

## 2023-09-09 DIAGNOSIS — E1065 Type 1 diabetes mellitus with hyperglycemia: Secondary | ICD-10-CM | POA: Diagnosis not present

## 2023-09-09 DIAGNOSIS — E785 Hyperlipidemia, unspecified: Secondary | ICD-10-CM

## 2023-09-09 DIAGNOSIS — E890 Postprocedural hypothyroidism: Secondary | ICD-10-CM | POA: Insufficient documentation

## 2023-09-09 DIAGNOSIS — E1165 Type 2 diabetes mellitus with hyperglycemia: Secondary | ICD-10-CM | POA: Insufficient documentation

## 2023-09-09 LAB — POCT GLYCOSYLATED HEMOGLOBIN (HGB A1C): Hemoglobin A1C: 7.4 % — AB (ref 4.0–5.6)

## 2023-09-09 MED ORDER — OMNIPOD 5 LIBRE2 PLUS G6 PODS MISC: 1.0000 | 3 refills | Status: DC

## 2023-09-09 MED ORDER — OMNIPOD 5 LIBRE2 PLUS G6 KIT: 1.0000 | PACK | 0 refills | Status: DC

## 2023-09-09 MED ORDER — ROSUVASTATIN CALCIUM 10 MG PO TABS: 10.0000 mg | ORAL_TABLET | Freq: Every day | ORAL | 3 refills | Status: DC

## 2023-09-09 NOTE — Progress Notes (Unsigned)
Name: [name]  MRN/ DOB: [id number], October 23, 1970   Age/ Sex: 52 y.o., male    PCP: Renaye Rakers, MD   Reason for Endocrinology Evaluation: Type 1 Diabetes Mellitus     Date of Initial Endocrinology Visit: 09/09/2023     PATIENT IDENTIFIER: [name] is a 52 y.o. male with a past medical history of DM, hypothyroidism. The patient presented for initial endocrinology clinic visit on 09/09/2023 for consultative assistance with his diabetes management.    HPI: Mr. Hext was    Diagnosed with DM at age 33 Currently checking blood sugars multiple  x / day Hypoglycemia episodes : yes               Symptoms: yes                 Frequency: daily   Hemoglobin A1c has ranged from 7.4% in 2024, peaking at 8.3% in 2020.   In terms of diet, the patient eats   Patient has been noted with a TSH of 21.08 uIU/mL on 06/09/2023  Pt with hx of DKA in the past. Last episode was   Denies nausea or vomiting  Denies constipation or diarrhea  Denies local neck swelling  Has been drowsy lately   He was diagnosed with Hypothyroidism in 2008. S/P RAI  due to hyperthyroidism   Works 3rd shift 10 pm to 6:30 Am . Sleeps 7AM-4 pm  , eats 3 meals a day    Grandmother with T2DM.  No Fh of thyroid disease   THYROID HISTORY : Pt was diagnosed with Hyperthyroidism in 2008 . He received RAI followed by LT-4 replacement    HOME DIABETES REGIMEN: Jardiance 25 mg daily-not taking Basaglar 32 units daily Novolog 1:10 TIDQAC    Statin: yes ACE-I/ARB: no   CONTINUOUS GLUCOSE MONITORING RECORD INTERPRETATION    Dates of Recording: 11/27-12/07/2023  Sensor description: freestyle   Results statistics:   CGM use % of time 71  Average and SD 154/46.4  Time in range    58    %  % Time Above 180 17  % Time above 250 13  % Time Below target 9   Glycemic patterns summary: BGs are optimal overnight and most of the day  Hyperglycemic episodes postprandial  Hypoglycemic  episodes occurred at variable times during the day and night  Overnight periods: Variable   DIABETIC COMPLICATIONS: Microvascular complications:   Denies: CKD, neuropathy , DR  Last eye exam: Completed 09/2022  Macrovascular complications:   Denies: CAD, PVD, CVA   PAST HISTORY: Past Medical History:  Past Medical History:  Diagnosis Date   Diabetes mellitus without complication (HCC)    Thyroid disease    Past Surgical History:  Past Surgical History:  Procedure Laterality Date   HEMATOMA EVACUATION     on L leg   NM THYROID STIM SUPRESS     "IN ACTIVE"    Social History:  reports that he has never smoked. He has never been exposed to tobacco smoke. He has never used smokeless tobacco. He reports that he does not drink alcohol and does not use drugs. Family History:  Family History  Problem Relation Age of Onset   Diabetes Mother    Diabetes Maternal Grandmother    Colon cancer Neg Hx    Colon polyps Neg Hx    Crohn's disease Neg Hx    Esophageal cancer Neg Hx    Rectal cancer Neg Hx    Stomach  cancer Neg Hx    Ulcerative colitis Neg Hx      HOME MEDICATIONS: Allergies as of 09/09/2023   No Known Allergies      Medication List        Accurate as of September 09, 2023 10:56 AM. If you have any questions, ask your nurse or doctor.          STOP taking these medications    insulin degludec 100 UNIT/ML FlexTouch Pen Commonly known as: Administrator, arts Stopped by: Johnney Ou Silveria Botz   insulin lispro 100 UNIT/ML KwikPen Commonly known as: HumaLOG KwikPen Stopped by: Johnney Ou Rekha Hobbins   Jardiance 25 MG Tabs tablet Generic drug: empagliflozin Stopped by: Johnney Ou Mayer Vondrak       TAKE these medications    acetaminophen 500 MG tablet Commonly known as: TYLENOL Take 1 tablet (500 mg total) by mouth every 6 (six) hours as needed.   Basaglar KwikPen 100 UNIT/ML Inject 32 Units into the skin daily.   FreeStyle [employer] 3 Sensor Misc See  admin instructions.   ibuprofen 600 MG tablet Commonly known as: ADVIL Take 1 tablet (600 mg total) by mouth every 6 (six) hours as needed.   levothyroxine 150 MCG tablet Commonly known as: SYNTHROID Take 150 mcg by mouth daily. What changed: Another medication with the same name was removed. Continue taking this medication, and follow the directions you see here. Changed by: Johnney Ou Keana Dueitt   NovoLOG FlexPen 100 UNIT/ML FlexPen Generic drug: insulin aspart 1 unit per 10 carbs   Omnipod 5 Libre2 Plus G6 Pods Misc 1 Device by Does not apply route every other day. Started by: Johnney Ou Dalonte Hardage   Omnipod 5 Libre2 Plus G6 Kit 1 Device by Does not apply route every other day. Started by: Johnney Ou Joanthan Hlavacek   rosuvastatin 10 MG tablet Commonly known as: Crestor Take 1 tablet (10 mg total) by mouth daily.   Vitamin D3 1.25 MG (50000 UT) Caps Take 1 capsule by mouth once a week.         ALLERGIES: No Known Allergies   REVIEW OF SYSTEMS: A comprehensive ROS was conducted with the patient and is negative except as per HPI     OBJECTIVE:   VITAL SIGNS: BP 134/86 (BP Location: Right Arm, Patient Position: Sitting, Cuff Size: Large)   Pulse 76   Ht 6\' 1"  (1.854 m)   Wt 237 lb (107.5 kg)   SpO2 96%   BMI 31.27 kg/m    PHYSICAL EXAM:  General: Pt appears well and is in NAD  Neck: General: Supple without adenopathy or carotid bruits. Thyroid: Thyroid size normal.  No goiter or nodules appreciated.   Lungs: Clear with good BS bilat   Heart: RRR   Abdomen:  soft, nontender  Extremities:  Lower extremities - No pretibial edema.   Neuro: MS is good with appropriate affect, pt is alert and Ox3    DM foot exam: 09/09/2023  The skin of the feet is intact without sores or ulcerations. The pedal pulses are 2+ on right and 2+ on left. The sensation is intact to a screening 5.07, 10 gram monofilament bilaterally    DATA REVIEWED:  Lab Results  Component  Value Date   HGBA1C 7.4 (A) 09/09/2023   HGBA1C 8.0 (A) 11/11/2018    05/31/2023 Na 135 K 4.6 Glucose 150 BUN 12 CR 1.36   ASSESSMENT / PLAN / RECOMMENDATIONS:   1) Type 1 Diabetes Mellitus, Sub-optimally controlled, Without complications - Most  recent A1c of 7.4 %. Goal A1c < 7.0 %.    Plan: GENERAL: I have discussed with the patient the pathophysiology of diabetes. We went over the natural progression of the disease. We stressed the importance of lifestyle changes. I explained the complications associated with diabetes including retinopathy, nephropathy, neuropathy as well as increased risk of cardiovascular disease. We went over the benefit seen with glycemic control.  I explained to the patient that diabetic patients are at higher than normal risk for amputations.  Patient states that insulin to carb ratio of 1:10 is insufficient.  Will change that as below I have advised the patient the importance of taking prandial insulin before the meal rather than after the meal I will decrease his basal insulin due to hypoglycemia He will also be provided with a correction scale to be used before each meal Patient interested in insulin pump technology, OmniPod Freestyle libre 2+ and freestyle libre 2+ sensors have been sent to the pharmacy A referral has been placed to Vernona Rieger to discuss low-carb diet, patient is interested in losing weight, and a referral has also been placed for Bonita Quin for [employer] training  MEDICATIONS: Decrease Basaglar 28 units daily Change NovoLog I:C ratio 1:8 TIDQAC CF : Novolog ( BG -130/35)   EDUCATION / INSTRUCTIONS: BG monitoring instructions: Patient is instructed to check his blood sugars 3 times a day. Call [facility] if: BG persistently < 70  I reviewed the Rule of 15 for the treatment of hypoglycemia in detail with the patient. Literature supplied.   2) Diabetic complications:  Eye: Does not have known diabetic retinopathy.  Neuro/  Feet: Does not have known diabetic peripheral neuropathy. Renal: Patient does not have known baseline CKD. He is not on an ACEI/ARB at present.  3) Dyslipidemia :    -LDL above goal -He has not been taking rosuvastatin -Discussed cardiovascular benefits of statin therapy and I have recommended restarting as below   Medication Restart rosuvastatin 10 mg daily  4) Postablative hypothyroidism:   -Patient with fatigue - Pt educated extensively on the correct way to take levothyroxine (first thing in the morning with water, 30 minutes before eating or taking other medications). - Pt encouraged to double dose the following day if she were to miss a dose given long half-life of levothyroxine.   Medication Levothyroxine 150 mcg daily    Follow-up in 3 months  Signed electronically by: Lyndle Herrlich, MD  [facility] Endocrinology  [facility] [address] [place] [place], [place] [address] Phone: [phone] FAX: [phone]   CC: Renaye Rakers, MD [address] [place], [address] [place] [address] Phone: [phone]  Fax: [phone]    Return to Endocrinology clinic as below: Future Appointments  Date Time Provider Department Center  12/15/2023  8:10 AM Ginna Schuur, Konrad Dolores, MD [facility] None

## 2023-09-09 NOTE — Patient Instructions (Signed)
Decrease Basaglar 28 units daily  Novolog  insulin to carb ratio 1:8 with each meal  Novolog correctional insulin: ADD extra units on insulin to your meal-time Novolog dose if your blood sugars are higher than 165. Use the scale below to help guide you:   Blood sugar before meal Number of units to inject  Less than 165 0 unit  166 -  200 1 units  201 -  235 2 units  236 -  270 3 units  271 -  305 4 units  306 -  340 5 units  341 -  375 6 units  376 -  410 7 units      HOW TO TREAT LOW BLOOD SUGARS (Blood sugar LESS THAN 70 MG/DL) Please follow the RULE OF 15 for the treatment of hypoglycemia treatment (when your (blood sugars are less than 70 mg/dL)   STEP 1: Take 15 grams of carbohydrates when your blood sugar is low, which includes:  3-4 GLUCOSE TABS  OR 3-4 OZ OF JUICE OR REGULAR SODA OR ONE TUBE OF GLUCOSE GEL    STEP 2: RECHECK blood sugar in 15 MINUTES STEP 3: If your blood sugar is still low at the 15 minute recheck --> then, go back to STEP 1 and treat AGAIN with another 15 grams of carbohydrates.

## 2023-09-10 ENCOUNTER — Telehealth: Payer: Self-pay | Admitting: Internal Medicine

## 2023-09-10 ENCOUNTER — Encounter: Payer: Self-pay | Admitting: Internal Medicine

## 2023-09-10 ENCOUNTER — Other Ambulatory Visit: Payer: Self-pay | Admitting: Internal Medicine

## 2023-09-10 LAB — TSH: TSH: 10.39 m[IU]/L — ABNORMAL HIGH (ref 0.40–4.50)

## 2023-09-10 LAB — MICROALBUMIN / CREATININE URINE RATIO
Creatinine, Urine: 170 mg/dL (ref 20–320)
Microalb Creat Ratio: 1 mg/g{creat} (ref <30)
Microalb, Ur: 0.2 mg/dL

## 2023-09-10 MED ORDER — LEVOTHYROXINE SODIUM 150 MCG PO TABS: 150.0000 ug | ORAL_TABLET | ORAL | 2 refills | Status: DC

## 2023-09-10 NOTE — Telephone Encounter (Signed)
Please let the patient know that his thyroid test continues to show that he is not on enough levothyroxine.[name]   He needs to take TWO tablets on Sundays from now on and 1 tablet Monday through Saturday   His urine test is normal, which is good news   Thanks

## 2023-09-11 NOTE — Telephone Encounter (Signed)
Patient aware and will  adjust medication

## 2023-09-15 ENCOUNTER — Other Ambulatory Visit (HOSPITAL_COMMUNITY): Payer: Self-pay

## 2023-09-15 ENCOUNTER — Telehealth: Payer: Self-pay

## 2023-09-15 NOTE — Telephone Encounter (Signed)
Pharmacy Patient Advocate Encounter   Received notification from CoverMyMeds that prior authorization for Omnipod 5 [name] is required/requested.   Insurance verification completed.   The patient is insured through [employer] [facility] .   Per test claim: PA required; PA started via CoverMyMeds. KEY [id number] . Waiting for clinical questions to populate.

## 2023-09-16 NOTE — Telephone Encounter (Signed)
Clinical info has been submitted

## 2023-09-17 ENCOUNTER — Ambulatory Visit: Payer: Federal, State, Local not specified - PPO | Admitting: Nutrition

## 2023-09-27 ENCOUNTER — Other Ambulatory Visit: Payer: Self-pay | Admitting: Internal Medicine

## 2023-09-30 ENCOUNTER — Other Ambulatory Visit: Payer: Self-pay

## 2023-09-30 DIAGNOSIS — R059 Cough, unspecified: Secondary | ICD-10-CM | POA: Diagnosis not present

## 2023-09-30 DIAGNOSIS — E161 Other hypoglycemia: Secondary | ICD-10-CM | POA: Diagnosis not present

## 2023-09-30 DIAGNOSIS — E6609 Other obesity due to excess calories: Secondary | ICD-10-CM | POA: Diagnosis not present

## 2023-09-30 DIAGNOSIS — E0590 Thyrotoxicosis, unspecified without thyrotoxic crisis or storm: Secondary | ICD-10-CM | POA: Diagnosis not present

## 2023-09-30 DIAGNOSIS — I10 Essential (primary) hypertension: Secondary | ICD-10-CM | POA: Diagnosis not present

## 2023-09-30 DIAGNOSIS — E1169 Type 2 diabetes mellitus with other specified complication: Secondary | ICD-10-CM | POA: Diagnosis not present

## 2023-09-30 MED ORDER — OMNIPOD 5 LIBRE2 PLUS G6 PODS MISC: 1.0000 | 3 refills | Status: DC

## 2023-09-30 MED ORDER — OMNIPOD 5 LIBRE2 PLUS G6 KIT: 1.0000 | PACK | 0 refills | Status: DC

## 2023-10-10 NOTE — Telephone Encounter (Signed)
 Pharmacy Patient Advocate Encounter  Received notification from [employer] that Prior Authorization for Omnipods has been APPROVED through 09/14/2024   PA #/Case ID/Reference #: [id number]

## 2023-10-15 ENCOUNTER — Telehealth: Payer: Self-pay | Admitting: Nutrition

## 2023-10-15 NOTE — Telephone Encounter (Signed)
LVM to call me to schedule pump training 

## 2023-11-26 ENCOUNTER — Telehealth: Payer: Self-pay | Admitting: Nutrition

## 2023-11-26 NOTE — Telephone Encounter (Signed)
LVM to call me to schedule pump training 

## 2023-12-15 ENCOUNTER — Ambulatory Visit: Payer: Federal, State, Local not specified - PPO | Admitting: Internal Medicine

## 2023-12-15 NOTE — Progress Notes (Deleted)
 Name: [name]  MRN/ DOB: [id number], 27-Jun-1971   Age/ Sex: 53 y.o., male    PCP: Renaye Rakers, MD   Reason for Endocrinology Evaluation: Type 1 Diabetes Mellitus     Date of Initial Endocrinology Visit: 09/09/2023    PATIENT IDENTIFIER: [name] is a 53 y.o. male with a past medical history of DM, hypothyroidism. The patient presented for initial endocrinology clinic visit on 12/10/2024for consultative assistance with his diabetes management.    HPI: [name] was    Diagnosed with DM at age 58   Hemoglobin A1c has ranged from 7.4% in 2024, peaking at 8.3% in 2020.  Pt with hx of DKA in the past   Works 3rd shift 10 pm to 6:30 Am . Sleeps 7AM-4 pm  , eats 3 meals a day    Grandmother with T2DM.   On his initial visit to our clinic he had an A1c of 7.4%, he was on multiple daily injections of insulin, he was also on Jardiance but he was not taking it  I prescribed OmniPod and CGM  THYROID HISTORY : Pt was diagnosed with Hyperthyroidism in 2008 . He received RAI followed by LT-4 replacement   NO FH of thyroid disease     SUBJECTIVE:   During the last visit (09/09/2023): A1c 7.4%  Today (12/15/23): [name] is here for follow-up on diabetes management.  He checks blood sugars *** times daily. The patient has *** had hypoglycemic episodes since the last clinic visit. The patient is *** symptomatic with these episodes.   He has not met with our CDE for OmniPod training yet  HOME DIABETES REGIMEN: Basaglar 28 units daily Novolog 1:8 TIDQAC  CF: Novolog (BG-130/35)  Rosuvastatin 10 mg daily Levothyroxine 150 mcg, 2 tabs on Sundays and 1 tab rest of the week       Statin: yes ACE-I/ARB: no   CONTINUOUS GLUCOSE MONITORING RECORD INTERPRETATION    Dates of Recording: 11/27-12/07/2023  Sensor description: freestyle   Results statistics:   CGM use % of time 71  Average and SD 154/46.4  Time in range    58    %   % Time Above 180 17  % Time above 250 13  % Time Below target 9   Glycemic patterns summary: BGs are optimal overnight and most of the day  Hyperglycemic episodes postprandial  Hypoglycemic episodes occurred at variable times during the day and night  Overnight periods: Variable   DIABETIC COMPLICATIONS: Microvascular complications:   Denies: CKD, neuropathy , DR  Last eye exam: Completed 09/2022  Macrovascular complications:   Denies: CAD, PVD, CVA   PAST HISTORY: Past Medical History:  Past Medical History:  Diagnosis Date   Diabetes mellitus without complication (HCC)    Thyroid disease    Past Surgical History:  Past Surgical History:  Procedure Laterality Date   HEMATOMA EVACUATION     on L leg   NM THYROID STIM SUPRESS     "IN ACTIVE"    Social History:  reports that he has never smoked. He has never been exposed to tobacco smoke. He has never used smokeless tobacco. He reports that he does not drink alcohol and does not use drugs. Family History:  Family History  Problem Relation Age of Onset   Diabetes Mother    Diabetes Maternal Grandmother    Colon cancer Neg Hx    Colon polyps Neg Hx    Crohn's disease Neg Hx  Esophageal cancer Neg Hx    Rectal cancer Neg Hx    Stomach cancer Neg Hx    Ulcerative colitis Neg Hx      HOME MEDICATIONS: Allergies as of 12/15/2023   No Known Allergies      Medication List        Accurate as of December 15, 2023  7:01 AM. If you have any questions, ask your nurse or doctor.          acetaminophen 500 MG tablet Commonly known as: TYLENOL Take 1 tablet (500 mg total) by mouth every 6 (six) hours as needed.   Basaglar KwikPen 100 UNIT/ML Inject 32 Units into the skin daily.   FreeStyle [employer] 3 Sensor Misc See admin instructions.   ibuprofen 600 MG tablet Commonly known as: ADVIL Take 1 tablet (600 mg total) by mouth every 6 (six) hours as needed.   levothyroxine 150 MCG tablet Commonly known as:  Euthyrox Take 1 tablet (150 mcg total) by mouth as directed. 2 tabs on Sundays and 1 tab rest of the week   NovoLOG FlexPen 100 UNIT/ML FlexPen Generic drug: insulin aspart 1 unit per 10 carbs   Omnipod 5 Libre2 Plus G6 Kit 1 Device by Does not apply route every other day.   Omnipod 5 Libre2 Plus G6 Pods Misc 1 Device by Does not apply route every other day.   rosuvastatin 10 MG tablet Commonly known as: Crestor Take 1 tablet (10 mg total) by mouth daily.   Vitamin D3 1.25 MG (50000 UT) Caps Take 1 capsule by mouth once a week.         ALLERGIES: No Known Allergies   REVIEW OF SYSTEMS: A comprehensive ROS was conducted with the patient and is negative except as per HPI     OBJECTIVE:   VITAL SIGNS: There were no vitals taken for this visit.   PHYSICAL EXAM:  General: Pt appears well and is in NAD  Neck: General: Supple without adenopathy or carotid bruits. Thyroid: Thyroid size normal.  No goiter or nodules appreciated.   Lungs: Clear with good BS bilat   Heart: RRR   Abdomen:  soft, nontender  Extremities:  Lower extremities - No pretibial edema.   Neuro: MS is good with appropriate affect, pt is alert and Ox3    DM foot exam: 09/09/2023  The skin of the feet is intact without sores or ulcerations. The pedal pulses are 2+ on right and 2+ on left. The sensation is intact to a screening 5.07, 10 gram monofilament bilaterally    DATA REVIEWED:  Lab Results  Component Value Date   HGBA1C 7.4 (A) 09/09/2023   HGBA1C 8.0 (A) 11/11/2018    Latest Reference Range & Units 09/09/23 10:44  TSH 0.40 - 4.50 mIU/L 10.39 (H)  Microalb, Ur mg/dL 0.2  MICROALB/CREAT RATIO <30 mg/g creat 1  Creatinine, Urine 20 - 320 mg/dL 324    12/30/270 Na 536 K 4.6 Glucose 150 BUN 12 CR 1.36   ASSESSMENT / PLAN / RECOMMENDATIONS:   1) Type 1 Diabetes Mellitus, Sub-optimally controlled, Without complications - Most recent A1c of 7.4 %. Goal A1c < 7.0 %.      MEDICATIONS: Decrease Basaglar 28 units daily Change NovoLog I:C ratio 1:8 TIDQAC CF : Novolog ( BG -130/35)   EDUCATION / INSTRUCTIONS: BG monitoring instructions: Patient is instructed to check his blood sugars 3 times a day. Call [facility] if: BG persistently < 70  I reviewed the Rule  of 15 for the treatment of hypoglycemia in detail with the patient. Literature supplied.   2) Diabetic complications:  Eye: Does not have known diabetic retinopathy.  Neuro/ Feet: Does not have known diabetic peripheral neuropathy. Renal: Patient does not have known baseline CKD. He is not on an ACEI/ARB at present.  3) Dyslipidemia :    -LDL above goal -He has not been taking rosuvastatin -Discussed cardiovascular benefits of statin therapy and I have recommended restarting as below   Medication Restart rosuvastatin 10 mg daily  4) Postablative hypothyroidism:   -Patient with fatigue - Pt educated extensively on the correct way to take levothyroxine (first thing in the morning with water, 30 minutes before eating or taking other medications). - Pt encouraged to double dose the following day if she were to miss a dose given long half-life of levothyroxine. -TSH above goal, I have encouraged compliance -Will increase levothyroxine as below   Medication Levothyroxine 150 mcg, 2 tabs on Sundays and 1 tab rest of the week   Follow-up in 3 months  Signed electronically by: Lyndle Herrlich, MD  [facility] Endocrinology  [facility] [address] [place] [place], [place] [address] Phone: [phone] FAX: [phone]   CC: Renaye Rakers, MD [address] [place], [address] [place] [address] Phone: [phone]  Fax: [phone]    Return to Endocrinology clinic as below: Future Appointments  Date Time Provider Department Center  12/15/2023  8:10 AM Danai Gotto, Konrad Dolores, MD [facility] None

## 2023-12-20 DIAGNOSIS — R404 Transient alteration of awareness: Secondary | ICD-10-CM | POA: Diagnosis not present

## 2023-12-20 DIAGNOSIS — E162 Hypoglycemia, unspecified: Secondary | ICD-10-CM | POA: Diagnosis not present

## 2023-12-20 DIAGNOSIS — R5383 Other fatigue: Secondary | ICD-10-CM | POA: Diagnosis not present

## 2023-12-20 DIAGNOSIS — R0902 Hypoxemia: Secondary | ICD-10-CM | POA: Diagnosis not present

## 2024-01-21 ENCOUNTER — Ambulatory Visit (INDEPENDENT_AMBULATORY_CARE_PROVIDER_SITE_OTHER): Admitting: Internal Medicine

## 2024-01-21 ENCOUNTER — Encounter: Payer: Self-pay | Admitting: Internal Medicine

## 2024-01-21 VITALS — BP 124/76 | HR 81 | Ht 73.0 in | Wt 237.0 lb

## 2024-01-21 DIAGNOSIS — E890 Postprocedural hypothyroidism: Secondary | ICD-10-CM | POA: Diagnosis not present

## 2024-01-21 DIAGNOSIS — E1065 Type 1 diabetes mellitus with hyperglycemia: Secondary | ICD-10-CM | POA: Diagnosis not present

## 2024-01-21 LAB — POCT GLYCOSYLATED HEMOGLOBIN (HGB A1C): Hemoglobin A1C: 7.4 % — AB (ref 4.0–5.6)

## 2024-01-21 MED ORDER — INSULIN PEN NEEDLE 32G X 4 MM MISC: 1.0000 | Freq: Four times a day (QID) | 3 refills | Status: DC

## 2024-01-21 MED ORDER — NOVOLOG FLEXPEN 100 UNIT/ML ~~LOC~~ SOPN: PEN_INJECTOR | SUBCUTANEOUS | 3 refills | Status: DC

## 2024-01-21 MED ORDER — BASAGLAR KWIKPEN 100 UNIT/ML ~~LOC~~ SOPN: 28.0000 [IU] | PEN_INJECTOR | Freq: Every day | SUBCUTANEOUS | 3 refills | Status: DC

## 2024-01-21 NOTE — Patient Instructions (Addendum)
 Continue Basaglar  28 units daily  Novolog  insulin  to carb ratio 1:8 with each meal  Novolog  correctional insulin : ADD extra units on insulin  to your meal-time Novolog  dose if your blood sugars are higher than 165. Use the scale below to help guide you:   Blood sugar before meal Number of units to inject  Less than 165 0 unit  166 -  200 1 units  201 -  235 2 units  236 -  270 3 units  271 -  305 4 units  306 -  340 5 units  341 -  375 6 units  376 -  410 7 units      HOW TO TREAT LOW BLOOD SUGARS (Blood sugar LESS THAN 70 MG/DL) Please follow the RULE OF 15 for the treatment of hypoglycemia treatment (when your (blood sugars are less than 70 mg/dL)   STEP 1: Take 15 grams of carbohydrates when your blood sugar is low, which includes:  3-4 GLUCOSE TABS  OR 3-4 OZ OF JUICE OR REGULAR SODA OR ONE TUBE OF GLUCOSE GEL    STEP 2: RECHECK blood sugar in 15 MINUTES STEP 3: If your blood sugar is still low at the 15 minute recheck --> then, go back to STEP 1 and treat AGAIN with another 15 grams of carbohydrates.

## 2024-01-21 NOTE — Progress Notes (Unsigned)
 Name: [name]  MRN/ DOB: [id number], 03-18-1971   Age/ Sex: 53 y.o., male    PCP: Jonathon Neighbors, MD   Reason for Endocrinology Evaluation: Type 1 Diabetes Mellitus     Date of Initial Endocrinology Visit: 01/21/2024     PATIENT IDENTIFIER: [name] is a 53 y.o. male with a past medical history of DM, hypothyroidism. The patient presented for initial endocrinology clinic visit on 01/21/2024 for consultative assistance with his diabetes management.    HPI: [name] was    Diagnosed with DM at age 74 Hemoglobin A1c has ranged from 7.4% in 2024, peaking at 8.3% in 2020.   Pt with hx of DKA in the past.   Works 3rd shift 10 pm to 6:30 Am . Sleeps 7AM-4 pm  , eats 3 meals a day    Grandmother with T2DM.  No Fh of thyroid  disease   THYROID  HISTORY : Pt was diagnosed with Hyperthyroidism in 2008 . He received RAI followed by LT-4 replacement    He had establish care with me in 2020 and was lost to follow-up until his return in 2024, with an A1c of 7.4%.  I adjusted MDI regimen and prescribed OmniPod  SUBJECTIVE:   During the last visit (09/09/2023): A1c 7.4%      Today (01/21/24): [name] is here for follow-up on diabetes and hypothyroid management. He checks blood sugars multiple times daily. The patient has  had hypoglycemic episodes since the last clinic visit. The patient is  symptomatic with these episodes.  Our CDE has  attempted to contact the patient to schedule OmniPod training without success x 2  Denies constipation or diarrhea  Denies nausea or vomiting    HOME DIABETES REGIMEN: Basaglar  28 units daily Novolog  1:8 TIDQAC  CF: NovoLog  (BG-130/35)  Rosuvastatin  10 mg daily Levothyroxine  150 mcg, 2 tablets on Sunday and 1 tablet rest of the week     Statin: yes ACE-I/ARB: no   CONTINUOUS GLUCOSE MONITORING RECORD INTERPRETATION    Dates of Recording: 4/9-4/22/2025  Sensor description: freestyle   Results  statistics:   CGM use % of time 100  Average and SD 173/50.9  Time in range  45   %  % Time Above 180 22  % Time above 250 20  % Time Below target 10   Glycemic patterns summary: BG's trend down overnight and fluctuate throughout the day  Hyperglycemic episodes postprandial  Hypoglycemic episodes occurred at variable times during the day and night  Overnight periods: Variable   DIABETIC COMPLICATIONS: Microvascular complications:   Denies: CKD, neuropathy , DR  Last eye exam: Completed 09/2022  Macrovascular complications:   Denies: CAD, PVD, CVA   PAST HISTORY: Past Medical History:  Past Medical History:  Diagnosis Date   Diabetes mellitus without complication (HCC)    Thyroid  disease    Past Surgical History:  Past Surgical History:  Procedure Laterality Date   HEMATOMA EVACUATION     on L leg   NM THYROID  STIM SUPRESS     "IN ACTIVE"    Social History:  reports that he has never smoked. He has never been exposed to tobacco smoke. He has never used smokeless tobacco. He reports that he does not drink alcohol and does not use drugs. Family History:  Family History  Problem Relation Age of Onset   Diabetes Mother    Diabetes Maternal Grandmother    Colon cancer Neg Hx    Colon polyps Neg Hx  Crohn's disease Neg Hx    Esophageal cancer Neg Hx    Rectal cancer Neg Hx    Stomach cancer Neg Hx    Ulcerative colitis Neg Hx      HOME MEDICATIONS: Allergies as of 01/21/2024   No Known Allergies      Medication List        Accurate as of January 21, 2024  2:48 PM. If you have any questions, ask your nurse or doctor.          STOP taking these medications    Omnipod 5 Libre2 Plus G6 Kit Stopped by: Camilla Cedar Dreshaun Stene   Omnipod 5 Libre2 Plus G6 Pods Misc Stopped by: Jaaziel Peatross J Cybill Uriegas       TAKE these medications    acetaminophen  500 MG tablet Commonly known as: TYLENOL  Take 1 tablet (500 mg total) by mouth every 6 (six) hours as  needed.   Basaglar  KwikPen 100 UNIT/ML Inject 32 Units into the skin daily.   FreeStyle [employer] 3 Sensor Misc See admin instructions.   ibuprofen  600 MG tablet Commonly known as: ADVIL  Take 1 tablet (600 mg total) by mouth every 6 (six) hours as needed.   levothyroxine  150 MCG tablet Commonly known as: Euthyrox  Take 1 tablet (150 mcg total) by mouth as directed. 2 tabs on Sundays and 1 tab rest of the week   NovoLOG  FlexPen 100 UNIT/ML FlexPen Generic drug: insulin  aspart 1 unit per 10 carbs   rosuvastatin  10 MG tablet Commonly known as: Crestor  Take 1 tablet (10 mg total) by mouth daily.   Vitamin D3 1.25 MG (50000 UT) Caps Take 1 capsule by mouth once a week.         ALLERGIES: No Known Allergies   REVIEW OF SYSTEMS: A comprehensive ROS was conducted with the patient and is negative except as per HPI     OBJECTIVE:   VITAL SIGNS: BP 124/76 (BP Location: Left Arm, Patient Position: Sitting, Cuff Size: Normal)   Pulse 81   Ht 6\' 1"  (1.854 m)   Wt 237 lb (107.5 kg)   SpO2 98%   BMI 31.27 kg/m    PHYSICAL EXAM:  General: Pt appears well and is in NAD  Lungs: Clear with good BS bilat   Heart: RRR   Abdomen:  soft, nontender  Extremities:  Lower extremities - No pretibial edema.   Neuro: MS is good with appropriate affect, pt is alert and Ox3    DM foot exam: 09/09/2023  The skin of the feet is intact without sores or ulcerations. The pedal pulses are 2+ on right and 2+ on left. The sensation is intact to a screening 5.07, 10 gram monofilament bilaterally    DATA REVIEWED:  Lab Results  Component Value Date   HGBA1C 7.4 (A) 01/21/2024   HGBA1C 7.4 (A) 09/09/2023   HGBA1C 8.0 (A) 11/11/2018    Latest Reference Range & Units 09/09/23 10:44  TSH 0.40 - 4.50 mIU/L 10.39 (H)  Microalb, Ur mg/dL 0.2  MICROALB/CREAT RATIO <30 mg/g creat 1  Creatinine, Urine 20 - 320 mg/dL 161      ASSESSMENT / PLAN / RECOMMENDATIONS:   1) Type 1 Diabetes  Mellitus, Sub-optimally controlled, Without complications - Most recent A1c of 7.4 %. Goal A1c < 7.0 %.     - A1c remains stable but above goal -He decreased his Basaglar , no hypoglycemia during sleep, but he continues to postprandial hyperglycemia, patient did not adjust his insulin  to carb ratio as  previously advised, he has not been using correction scale as previously advised -I will ask him again to adjust his insulin  to carb ratio -He will be encouraged to use correction scale as needed -Our CDE attempted to contact him twice to schedule an OmniPod but have not been able to get hold of them, patient is still interested, he will schedule an appointment   MEDICATIONS: Continue Basaglar  28 units daily Change NovoLog  I:C ratio 1:8 TIDQAC CF : Novolog  ( BG -130/35)   EDUCATION / INSTRUCTIONS: BG monitoring instructions: Patient is instructed to check his blood sugars 3 times a day. Call  Endocrinology clinic if: BG persistently < 70  I reviewed the Rule of 15 for the treatment of hypoglycemia in detail with the patient. Literature supplied.   2) Diabetic complications:  Eye: Does not have known diabetic retinopathy.  Neuro/ Feet: Does not have known diabetic peripheral neuropathy. Renal: Patient does not have known baseline CKD. He is not on an ACEI/ARB at present.  3) Dyslipidemia :    -LDL above goal -He has not been taking rosuvastatin  -Discussed cardiovascular benefits of statin therapy and I have recommended restarting as below   Medication Restart rosuvastatin  10 mg daily  4) Postablative hypothyroidism:   -Patient with fatigue - Pt educated extensively on the correct way to take levothyroxine  (first thing in the morning with water, 30 minutes before eating or taking other medications). - Pt encouraged to double dose the following day if she were to miss a dose given long half-life of levothyroxine . -TSH above goal, I have encouraged compliance -Will increase  levothyroxine  as below   Medication Levothyroxine  150 mcg, 2 tabs on Sundays and 1 tab rest of the week   Follow-up in 3 months  Signed electronically by: Natale Bail, MD  [facility] Endocrinology  [facility] [address] [place], [place] [address] Phone: [phone] FAX: [phone]   CC: Jonathon Neighbors, MD [address] [place], [address] [place] [address] Phone: [phone]  Fax: [phone]    Return to Endocrinology clinic as below: No future appointments.

## 2024-01-22 ENCOUNTER — Encounter: Payer: Self-pay | Admitting: Internal Medicine

## 2024-01-22 ENCOUNTER — Telehealth: Payer: Self-pay | Admitting: Internal Medicine

## 2024-01-22 LAB — T4, FREE: Free T4: 0.9 ng/dL (ref 0.8–1.8)

## 2024-01-22 LAB — TSH: TSH: 7.99 m[IU]/L — ABNORMAL HIGH (ref 0.40–4.50)

## 2024-01-22 MED ORDER — LEVOTHYROXINE SODIUM 175 MCG PO TABS: 175.0000 ug | ORAL_TABLET | Freq: Every day | ORAL | 3 refills | Status: DC

## 2024-01-22 NOTE — Telephone Encounter (Signed)
 LVTCB

## 2024-01-22 NOTE — Telephone Encounter (Signed)
 Please let the patient know that his thyroid  continues to be underactive, and asked him to stop levothyroxine  150 mcg and start the new prescription of levothyroxine  175 mcg daily   Thanks

## 2024-01-22 NOTE — Telephone Encounter (Signed)
Patient notified and will pick up new dose  

## 2024-02-12 DIAGNOSIS — E162 Hypoglycemia, unspecified: Secondary | ICD-10-CM | POA: Diagnosis not present

## 2024-02-12 DIAGNOSIS — R42 Dizziness and giddiness: Secondary | ICD-10-CM | POA: Diagnosis not present

## 2024-02-12 DIAGNOSIS — R404 Transient alteration of awareness: Secondary | ICD-10-CM | POA: Diagnosis not present

## 2024-02-12 DIAGNOSIS — R231 Pallor: Secondary | ICD-10-CM | POA: Diagnosis not present

## 2024-03-01 DIAGNOSIS — E1069 Type 1 diabetes mellitus with other specified complication: Secondary | ICD-10-CM | POA: Diagnosis not present

## 2024-03-01 DIAGNOSIS — E7800 Pure hypercholesterolemia, unspecified: Secondary | ICD-10-CM | POA: Diagnosis not present

## 2024-03-01 DIAGNOSIS — Z125 Encounter for screening for malignant neoplasm of prostate: Secondary | ICD-10-CM | POA: Diagnosis not present

## 2024-03-01 DIAGNOSIS — I10 Essential (primary) hypertension: Secondary | ICD-10-CM | POA: Diagnosis not present

## 2024-03-01 DIAGNOSIS — E1169 Type 2 diabetes mellitus with other specified complication: Secondary | ICD-10-CM | POA: Diagnosis not present

## 2024-03-01 DIAGNOSIS — E559 Vitamin D deficiency, unspecified: Secondary | ICD-10-CM | POA: Diagnosis not present

## 2024-03-01 DIAGNOSIS — E0590 Thyrotoxicosis, unspecified without thyrotoxic crisis or storm: Secondary | ICD-10-CM | POA: Diagnosis not present

## 2024-03-10 ENCOUNTER — Telehealth: Payer: Self-pay | Admitting: Nutrition

## 2024-03-10 NOTE — Telephone Encounter (Signed)
 We are starting him on his pump next week.  Please order him the [place] 2 plus sensors, and Novolog  in a vial.  HE will take approx. 50u/day.  THank you

## 2024-03-11 ENCOUNTER — Other Ambulatory Visit: Payer: Self-pay

## 2024-03-11 DIAGNOSIS — E1065 Type 1 diabetes mellitus with hyperglycemia: Secondary | ICD-10-CM

## 2024-03-11 MED ORDER — INSULIN ASPART 100 UNIT/ML IJ SOLN: INTRAMUSCULAR | 3 refills | Status: DC

## 2024-03-11 MED ORDER — FREESTYLE LIBRE 2 PLUS SENSOR MISC: 1.0000 | 1 refills | Status: DC

## 2024-03-11 NOTE — Telephone Encounter (Signed)
 This is Dr. Rosalea Collin patient, I routed message to her.

## 2024-03-16 ENCOUNTER — Encounter: Attending: Internal Medicine | Admitting: Nutrition

## 2024-03-16 DIAGNOSIS — E1065 Type 1 diabetes mellitus with hyperglycemia: Secondary | ICD-10-CM | POA: Insufficient documentation

## 2024-03-17 ENCOUNTER — Telehealth: Payer: Self-pay | Admitting: Nutrition

## 2024-03-17 NOTE — Progress Notes (Signed)
 Patient was identified by name and Dob.  He was trained on the use of the OmniPod 5 insulin  pump.  Settings were put into the pump per Dr. Eleonore Grill orders:  Basal rate: 0.85, Max Basal: 1.7u/hr, ISF: 35, I/C: 8, target: 110 with correction over 110, timing: 4 hours, max bolus 30.  He is currently wearing a Libre 2 PLus sensor.  This was linked to [facility], and to his PDM.  He put on a new sensor to do this.  He filled a pod with Novolog  insulin  and was shown how and where to place the pod with reference to his sensor.  His pod was started at 4PM.  We reviewed all topics on the pump training checklist, and he signed this as understanding all topics.  He was given a pump pamphlet with review of sick day guidelines, high and low blood sugar protocols, emergency supplies to carry with you, and he was encouraged to read this along with his pump training booklet.  He agreed to do this and had no final questions. His pump data was linked to [employer] with user name: [email or web address] and password: [phone]! He had no final questions.

## 2024-03-17 NOTE — Patient Instructions (Addendum)
 Change pod every 3 days or when empty Change sensor every 15 days Call OmniPod help line if questions about your pump Call Libre help line if questions or problems with your sensors. Call office if blood sugars drop below 70 or remain over 250

## 2024-03-23 ENCOUNTER — Telehealth: Payer: Self-pay | Admitting: Nutrition

## 2024-03-23 NOTE — Telephone Encounter (Signed)
 Patient reported no difficulty with using the pump.  He reports that 2nd pod did not work and blood sugars went very high for a whole day.  Discussed high blood sugar protocol and what could have gone wrong.  He reports having changed pod and blood sugar came down.  Now it is 112.  FBS today was 99.  He had no questions for me at this time.

## 2024-03-23 NOTE — Telephone Encounter (Signed)
 Patient is wondering if you can order more pods for his Omnipod 5 insulin  pump from [facility].  He is using the [place] 2 plus sensor.

## 2024-03-24 MED ORDER — OMNIPOD 5 LIBRE2 PLUS G6 PODS MISC: 1.0000 | 2 refills | Status: DC

## 2024-03-24 NOTE — Telephone Encounter (Signed)
 Done

## 2024-03-29 ENCOUNTER — Other Ambulatory Visit: Payer: Self-pay

## 2024-03-29 MED ORDER — OMNIPOD 5 LIBRE2 PLUS G6 PODS MISC: 1.0000 | 2 refills | Status: DC

## 2024-04-06 NOTE — Telephone Encounter (Signed)
 Opened in error

## 2024-07-12 DIAGNOSIS — Z129 Encounter for screening for malignant neoplasm, site unspecified: Secondary | ICD-10-CM | POA: Diagnosis not present

## 2024-07-12 DIAGNOSIS — E559 Vitamin D deficiency, unspecified: Secondary | ICD-10-CM | POA: Diagnosis not present

## 2024-07-12 DIAGNOSIS — E0590 Thyrotoxicosis, unspecified without thyrotoxic crisis or storm: Secondary | ICD-10-CM | POA: Diagnosis not present

## 2024-07-12 DIAGNOSIS — E1069 Type 1 diabetes mellitus with other specified complication: Secondary | ICD-10-CM | POA: Diagnosis not present

## 2024-07-12 DIAGNOSIS — I10 Essential (primary) hypertension: Secondary | ICD-10-CM | POA: Diagnosis not present

## 2024-07-12 LAB — LAB REPORT - SCANNED
EGFR: 77
Free T4: 1.1
PSA, Total: 0.67

## 2024-07-16 ENCOUNTER — Other Ambulatory Visit: Payer: Self-pay

## 2024-07-16 ENCOUNTER — Emergency Department (HOSPITAL_COMMUNITY)
Admission: EM | Admit: 2024-07-16 | Discharge: 2024-07-16 | Disposition: A | Discharge status: Home / Self Care | Attending: Emergency Medicine | Admitting: Emergency Medicine

## 2024-07-16 ENCOUNTER — Encounter (HOSPITAL_COMMUNITY): Payer: Self-pay | Admitting: Emergency Medicine

## 2024-07-16 DIAGNOSIS — E11649 Type 2 diabetes mellitus with hypoglycemia without coma: Secondary | ICD-10-CM | POA: Diagnosis not present

## 2024-07-16 DIAGNOSIS — E161 Other hypoglycemia: Secondary | ICD-10-CM | POA: Diagnosis not present

## 2024-07-16 DIAGNOSIS — E876 Hypokalemia: Secondary | ICD-10-CM | POA: Diagnosis not present

## 2024-07-16 DIAGNOSIS — E162 Hypoglycemia, unspecified: Secondary | ICD-10-CM | POA: Diagnosis not present

## 2024-07-16 LAB — CBC WITH DIFFERENTIAL/PLATELET
Abs Immature Granulocytes: 0.01 K/uL (ref 0.00–0.07)
Basophils Absolute: 0 K/uL (ref 0.0–0.1)
Basophils Relative: 1 %
Eosinophils Absolute: 0.1 K/uL (ref 0.0–0.5)
Eosinophils Relative: 1 %
HCT: 39.3 % (ref 39.0–52.0)
Hemoglobin: 12.6 g/dL — ABNORMAL LOW (ref 13.0–17.0)
Immature Granulocytes: 0 %
Lymphocytes Relative: 21 %
Lymphs Abs: 1.3 K/uL (ref 0.7–4.0)
MCH: 27 pg (ref 26.0–34.0)
MCHC: 32.1 g/dL (ref 30.0–36.0)
MCV: 84.3 fL (ref 80.0–100.0)
Monocytes Absolute: 0.5 K/uL (ref 0.1–1.0)
Monocytes Relative: 8 %
Neutro Abs: 4.2 K/uL (ref 1.7–7.7)
Neutrophils Relative %: 69 %
Platelets: 183 K/uL (ref 150–400)
RBC: 4.66 MIL/uL (ref 4.22–5.81)
RDW: 14 % (ref 11.5–15.5)
WBC: 6.1 K/uL (ref 4.0–10.5)
nRBC: 0 % (ref 0.0–0.2)

## 2024-07-16 LAB — CBG MONITORING, ED
Glucose-Capillary: 126 mg/dL — ABNORMAL HIGH (ref 70–99)
Glucose-Capillary: 168 mg/dL — ABNORMAL HIGH (ref 70–99)
Glucose-Capillary: 228 mg/dL — ABNORMAL HIGH (ref 70–99)
Glucose-Capillary: 235 mg/dL — ABNORMAL HIGH (ref 70–99)

## 2024-07-16 LAB — BASIC METABOLIC PANEL WITH GFR
Anion gap: 12 (ref 5–15)
BUN: 19 mg/dL (ref 6–20)
CO2: 23 mmol/L (ref 22–32)
Calcium: 9 mg/dL (ref 8.9–10.3)
Chloride: 102 mmol/L (ref 98–111)
Creatinine, Ser: 1.08 mg/dL (ref 0.61–1.24)
GFR, Estimated: >60 mL/min (ref >60)
Glucose, Bld: 159 mg/dL — ABNORMAL HIGH (ref 70–99)
Potassium: 2.9 mmol/L — ABNORMAL LOW (ref 3.5–5.1)
Sodium: 137 mmol/L (ref 135–145)

## 2024-07-16 MED ORDER — POTASSIUM CHLORIDE CRYS ER 20 MEQ PO TBCR: 40.0000 meq | EXTENDED_RELEASE_TABLET | Freq: Every day | ORAL | 0 refills | Status: AC

## 2024-07-16 MED ORDER — POTASSIUM CHLORIDE CRYS ER 20 MEQ PO TBCR
40.0000 meq | EXTENDED_RELEASE_TABLET | Freq: Once | ORAL | Status: AC
  Administered 2024-07-16: 40 meq via ORAL
  Filled 2024-07-16: qty 2
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
## 2024-07-16 NOTE — Discharge Instructions (Signed)
 Please follow closely with your Endocrinologist on Monday. Continue to follow your blood sugars closely at home. Please do not drive until cleared to do so by your Endocrinologist. I have also called in a prescription for replacement potassium to take over the next few days. Your PCP should repeat your potassium in the next week to make sure it is improving.

## 2024-07-16 NOTE — Inpatient Diabetes Management (Signed)
 Inpatient Diabetes Program Recommendations  AACE/ADA: New Consensus Statement on Inpatient Glycemic Control (2015)  Target Ranges:  Prepandial:   less than 140 mg/dL      Peak postprandial:   less than 180 mg/dL (1-2 hours)      Critically ill patients:  140 - 180 mg/dL   Lab Results  Component Value Date   GLUCAP 235 (H) 07/16/2024   HGBA1C 7.4 (A) 01/21/2024    Review of Glycemic Control  Latest Reference Range & Units 07/16/24 09:56 07/16/24 10:55 07/16/24 11:48 07/16/24 13:34  Glucose-Capillary 70 - 99 mg/dL 873 (H) 831 (H) 771 (H) 235 (H)   Diabetes history: DM type 1 since age 54 Outpatient Diabetes medications: Novolog  sliding scale and carb coverage 1:8, Basaglar  28 units between 3/4 pm Current orders for Inpatient glycemic control:  None  Endocrinologist Dr. Sam next appointment 10/17   A1c 7.4% on 4/23 Pt reports a recent A1c by PCP reading at an 8%   Spoke with pt and brother at bedside. Pt noted higher than normal glucose trends last week and feeling mild cold symptoms. No new medication taken. 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 Pt has been on an insulin  pump in the past but has not been on it for about 2.5 months due to insurance coverage. Pt on basal bolus with Basaglar  and Novolog . Pt does report sometimes not eating a balanced diet. Pt ate a snack of chips around 4 am. Pt does report getting food on the way home and did not do it at this time. I did suggest to patient to eat protein with a carb snack for glucose stabilization. Pt CGM alarming the past 2 days. Also gave pt a replacement CGM (Freestyle Libre 3 Plus). Pt reports pump trainer looking into a medtronic pump for pt.  Thanks,  Clotilda Bull RN, MSN, BC-ADM Inpatient Diabetes Coordinator Team Pager [phone] (8a-5p)
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
## 2024-07-19 ENCOUNTER — Ambulatory Visit: Admitting: Internal Medicine

## 2024-07-19 NOTE — Progress Notes (Deleted)
 Name: [name]  MRN/ DOB: [id number], 1971-03-05   Age/ Sex: 53 y.o., male    PCP: Benjamine Aland, MD   Reason for Endocrinology Evaluation: Type 1 Diabetes Mellitus     Date of Initial Endocrinology Visit: 07/19/2024     PATIENT IDENTIFIER: [name] is a 53 y.o. male with a past medical history of DM, hypothyroidism. The patient presented for initial endocrinology clinic visit on 07/19/2024 for consultative assistance with his diabetes management.    HPI: Mr. Doell was    Diagnosed with DM at age 34 Hemoglobin A1c has ranged from 7.4% in 2024, peaking at 8.3% in 2020.   Pt with hx of DKA in the past.   Works 3rd shift 10 pm to 6:30 Am . Sleeps 7AM-4 pm  , eats 3 meals a day    Grandmother with T2DM.  No Fh of thyroid  disease   THYROID  HISTORY : Pt was diagnosed with Hyperthyroidism in 2008 . He received RAI followed by LT-4 replacement    He had establish care with me in 2020 and was lost to follow-up until his return in 2024, with an A1c of 7.4%.  I adjusted MDI regimen and prescribed OmniPod   Patient was trained on the OmniPod in June, 2025  SUBJECTIVE:   During the last visit (01/21/2024): A1c 7.4%      Today (07/19/24): [name] is here for follow-up on diabetes and hypothyroid management. He checks blood sugars multiple times daily. The patient has  had hypoglycemic episodes since the last clinic visit. The patient is  symptomatic with these episodes.  The patient presented to the ED on 07/16/2024 with hypoglycemia while parking his car.  His blood glucose on scene was 45 MGs/DL, was given glucagon.  The patient worked third shift, ate around 3:30 AM with insulin  administration, prior to driving home his BG was 788 MGs/DL but by the time he got home to park last 1 he became incoherent   denies constipation or diarrhea  Denies nausea or vomiting   This patient with type 1 diabetes is treated with OmniPod (insulin  pump).  During the visit the pump basal and bolus doses were reviewed including carb/insulin  rations and supplemental doses. The clinical list was updated. The glucose meter download was reviewed in detail to determine if the current pump settings are providing the best glycemic control without excessive hypoglycemia.  Pump and meter download:    Pump   OmniPod Settings   Insulin  type   NovoLog    Basal rate       0000 0.85 u/h               I:C ratio       0000 1:8                   Sensitivity       0000  35       Goal       0000  110            Type & Model of Pump: OmniPod Insulin  Type: Currently using a log.  There is no height or weight on file to calculate BMI.  PUMP STATISTICS: Average BG: *** Average Daily Carbs (g): ***  Average Total Daily Insulin : ***  Average Daily Basal: *** (*** %) Average Daily Bolus: *** (*** %)    HOME DIABETES REGIMEN: Novolog  1:8 TIDQAC  Rosuvastatin  10 mg daily Levothyroxine  150 mcg, 2 tablets on Sunday and  1 tablet rest of the week     Statin: yes ACE-I/ARB: no   CONTINUOUS GLUCOSE MONITORING RECORD INTERPRETATION    Dates of Recording: 4/9-4/22/2025  Sensor description: freestyle   Results statistics:   CGM use % of time 100  Average and SD 173/50.9  Time in range  45   %  % Time Above 180 22  % Time above 250 20  % Time Below target 10   Glycemic patterns summary: BG's trend down overnight and fluctuate throughout the day  Hyperglycemic episodes postprandial  Hypoglycemic episodes occurred at variable times during the day and night  Overnight periods: Variable   DIABETIC COMPLICATIONS: Microvascular complications:   Denies: CKD, neuropathy , DR  Last eye exam: Completed 09/2022  Macrovascular complications:   Denies: CAD, PVD, CVA   PAST HISTORY: Past Medical History:  Past Medical History:  Diagnosis Date   Diabetes mellitus without complication (HCC)    Thyroid  disease    Past Surgical  History:  Past Surgical History:  Procedure Laterality Date   HEMATOMA EVACUATION     on L leg   NM THYROID  STIM SUPRESS     IN ACTIVE    Social History:  reports that he has never smoked. He has never been exposed to tobacco smoke. He has never used smokeless tobacco. He reports that he does not drink alcohol and does not use drugs. Family History:  Family History  Problem Relation Age of Onset   Diabetes Mother    Diabetes Maternal Grandmother    Colon cancer Neg Hx    Colon polyps Neg Hx    Crohn's disease Neg Hx    Esophageal cancer Neg Hx    Rectal cancer Neg Hx    Stomach cancer Neg Hx    Ulcerative colitis Neg Hx      HOME MEDICATIONS: Allergies as of 07/19/2024   No Known Allergies      Medication List        Accurate as of July 19, 2024  6:55 AM. If you have any questions, ask your nurse or doctor.          acetaminophen  500 MG tablet Commonly known as: TYLENOL  Take 1 tablet (500 mg total) by mouth every 6 (six) hours as needed.   FreeStyle [employer] 3 Sensor Misc See admin instructions.   FreeStyle Libre 2 Plus Sensor Misc 1 applicator by Does not apply route continuous. Apply to skin and change every 15 days as directed by manufacturer   ibuprofen  600 MG tablet Commonly known as: ADVIL  Take 1 tablet (600 mg total) by mouth every 6 (six) hours as needed.   insulin  aspart 100 UNIT/ML injection Commonly known as: novoLOG  Max daily 50 units   levothyroxine  175 MCG tablet Commonly known as: SYNTHROID  Take 1 tablet (175 mcg total) by mouth daily.   Omnipod 5 Libre2 Plus G6 Pods Misc 1 each by Does not apply route every 3 (three) days.   potassium chloride SA 20 MEQ tablet Commonly known as: KLOR-CON M Take 2 tablets (40 mEq total) by mouth daily for 4 days.   rosuvastatin  10 MG tablet Commonly known as: Crestor  Take 1 tablet (10 mg total) by mouth daily.   Vitamin D3 1.25 MG (50000 UT) Caps Take 1 capsule by mouth once a week.          ALLERGIES: No Known Allergies   REVIEW OF SYSTEMS: A comprehensive ROS was conducted with the patient and is negative except as per  HPI     OBJECTIVE:   VITAL SIGNS: There were no vitals taken for this visit.   PHYSICAL EXAM:  General: Pt appears well and is in NAD  Lungs: Clear with good BS bilat   Heart: RRR   Abdomen:  soft, nontender  Extremities:  Lower extremities - No pretibial edema.   Neuro: MS is good with appropriate affect, pt is alert and Ox3    DM foot exam: 09/09/2023  The skin of the feet is intact without sores or ulcerations. The pedal pulses are 2+ on right and 2+ on left. The sensation is intact to a screening 5.07, 10 gram monofilament bilaterally    DATA REVIEWED:  Lab Results  Component Value Date   HGBA1C 7.4 (A) 01/21/2024   HGBA1C 7.4 (A) 09/09/2023   HGBA1C 8.0 (A) 11/11/2018    Latest Reference Range & Units 07/16/24 10:18  Sodium 135 - 145 mmol/L 137  Potassium 3.5 - 5.1 mmol/L 2.9 (L)  Chloride 98 - 111 mmol/L 102  CO2 22 - 32 mmol/L 23  Glucose 70 - 99 mg/dL 840 (H)  BUN 6 - 20 mg/dL 19  Creatinine 9.38 - 8.75 mg/dL 8.91  Calcium  8.9 - 10.3 mg/dL 9.0  Anion gap 5 - 15  12  GFR, Estimated >60 mL/min >60  (L): Data is abnormally low (H): Data is abnormally high    ASSESSMENT / PLAN / RECOMMENDATIONS:   1) Type 1 Diabetes Mellitus, Sub-optimally controlled, Without complications - Most recent A1c of 7.4 %. Goal A1c < 7.0 %.     - A1c remains stable but above goal -He decreased his Basaglar , no hypoglycemia during sleep, but he continues to postprandial hyperglycemia, patient did not adjust his insulin  to carb ratio as previously advised, he has not been using correction scale as previously advised -I will ask him again to adjust his insulin  to carb ratio -He will be encouraged to use correction scale as needed -Our CDE attempted to contact him twice to schedule an OmniPod but have not been able to get hold of them,  patient is still interested, he will schedule an appointment   MEDICATIONS: Continue Basaglar  28 units daily Change NovoLog  I:C ratio 1:8 TIDQAC CF : Novolog  ( BG -130/35)   EDUCATION / INSTRUCTIONS: BG monitoring instructions: Patient is instructed to check his blood sugars 3 times a day. Call [facility] if: BG persistently < 70  I reviewed the Rule of 15 for the treatment of hypoglycemia in detail with the patient. Literature supplied.   2) Diabetic complications:  Eye: Does not have known diabetic retinopathy.  Neuro/ Feet: Does not have known diabetic peripheral neuropathy. Renal: Patient does not have known baseline CKD. He is not on an ACEI/ARB at present.  3) Dyslipidemia :    -LDL  -He has not been taking rosuvastatin  -Discussed cardiovascular benefits of statin therapy and I have recommended restarting as below   Medication Restart rosuvastatin  10 mg daily  4) Postablative hypothyroidism:   - The patient was supposed to increase levothyroxine  by taking 2 tablets once a week and continue to take 1 tablet the other 6 days of the week, but unfortunately he did not do that - TSH continues to be elevated - I will increase levothyroxine  as below   Medication Stop levothyroxine  150 mcg Start levothyroxine  175 mcg daily   Follow-up in 6 months  Signed electronically by: Stefano Redgie Butts, MD  [facility]  [facility] [address], [address] [place], [place] [address] Phone: [phone] FAX: [phone]   CC: Benjamine Aland, MD [address] [place], [address] [place] [address] Phone: [phone]  Fax: [phone]    Return to Endocrinology clinic as below: Future Appointments  Date Time Provider Department Center  07/19/2024  8:10 AM Lawsen Arnott, Donell Cardinal, MD [facility] None

## 2024-07-26 ENCOUNTER — Ambulatory Visit: Admitting: Internal Medicine

## 2024-07-26 ENCOUNTER — Encounter: Payer: Self-pay | Admitting: Internal Medicine

## 2024-07-26 ENCOUNTER — Other Ambulatory Visit

## 2024-07-26 VITALS — BP 130/70 | HR 113 | Ht 73.0 in | Wt 231.4 lb

## 2024-07-26 DIAGNOSIS — E1065 Type 1 diabetes mellitus with hyperglycemia: Secondary | ICD-10-CM

## 2024-07-26 DIAGNOSIS — E890 Postprocedural hypothyroidism: Secondary | ICD-10-CM

## 2024-07-26 DIAGNOSIS — E785 Hyperlipidemia, unspecified: Secondary | ICD-10-CM

## 2024-07-26 LAB — POCT GLYCOSYLATED HEMOGLOBIN (HGB A1C): Hemoglobin A1C: 7.7 % — AB (ref 4.0–5.6)

## 2024-07-26 MED ORDER — ROSUVASTATIN CALCIUM 20 MG PO TABS: 20.0000 mg | ORAL_TABLET | Freq: Every day | ORAL | 3 refills | Status: AC

## 2024-07-26 MED ORDER — FREESTYLE LIBRE 3 PLUS SENSOR MISC: 1.0000 | 3 refills | Status: AC

## 2024-07-26 MED ORDER — NOVOLOG FLEXPEN 100 UNIT/ML ~~LOC~~ SOPN: PEN_INJECTOR | SUBCUTANEOUS | 3 refills | Status: AC

## 2024-07-26 MED ORDER — INSULIN PEN NEEDLE 32G X 4 MM MISC: 1.0000 | Freq: Four times a day (QID) | 3 refills | Status: AC

## 2024-07-26 MED ORDER — BASAGLAR KWIKPEN 100 UNIT/ML ~~LOC~~ SOPN: 24.0000 [IU] | PEN_INJECTOR | Freq: Every day | SUBCUTANEOUS | 3 refills | Status: AC

## 2024-07-26 NOTE — Progress Notes (Unsigned)
 Name: [name]  MRN/ DOB: [id number], [birth year]   Age/ Sex: 53 y.o., male    PCP: Benjamine Aland, [name]   Reason for Endocrinology Evaluation: Type 1 Diabetes Mellitus     Date of Initial Endocrinology Visit: 07/26/2024     PATIENT IDENTIFIER: [name] is a 53 y.o. male with a past medical history of DM, hypothyroidism. The patient presented for initial endocrinology clinic visit on 07/26/2024 for consultative assistance with his diabetes management.    HPI: [name] was    Diagnosed with DM at age 68 Hemoglobin A1c has ranged from 7.4% in 2024, peaking at 8.3% in 2020.   Pt with hx of DKA in the past.   Works 3rd shift 10 pm to 6:30 Am . Sleeps 7AM-4 pm  , eats 3 meals a day    Grandmother with T2DM.  No Fh of thyroid  disease   THYROID  HISTORY :  Pt was diagnosed with Hyperthyroidism in 2008 . He received RAI followed by LT-4 replacement    He had establish care with me in 2020 and was lost to follow-up until his return in 2024, with an A1c of 7.4%.  I adjusted MDI regimen and prescribed OmniPod   Patient was trained on the OmniPod in June, 2025  He was off the OmniPod by October, 2025 due to cost  SUBJECTIVE:   During the last visit (01/21/2024): A1c 7.4%      Today (07/26/24): [name] is here for follow-up on diabetes and hypothyroid management. He checks blood sugars multiple times daily. The patient has  had hypoglycemic episodes since the last clinic visit. The patient is  symptomatic with these episodes.  The patient presented to the ED on 07/16/2024 with hypoglycemia while parking his car.  His blood glucose on scene was 45 MGs/DL, was given glucagon.  The patient worked third shift, ate around 3:30 AM with insulin  administration, prior to driving home his BG was 788 MGs/DL but by the time he got home to park, he became incoherent.  The patient did endorse that freestyle libre stopped recording his BG's but he has been  using a regular fingerstick.   No nausea or vomiting  No constipation or diarrhea    HOME DIABETES REGIMEN: Basaglar  28 units daily Novolog  1:10 TIDQAC  Rosuvastatin  10 mg daily Levothyroxine  175 mcg, daily      Statin: yes ACE-I/ARB: no   CONTINUOUS GLUCOSE MONITORING RECORD INTERPRETATION    Dates of Recording: 10/14-10/27/2025  Sensor description: freestyle   Results statistics:   CGM use % of time 64  Average and SD 163/59.6  Time in range 41  %  % Time Above 180 14  % Time above 250 24  % Time Below target 12   Glycemic patterns summary: Patient with glycemic excursions ranging between 40s >350 MGs/DL Hyperglycemic episodes postprandial  Hypoglycemic episodes occurred during the day  Overnight periods: Variable   DIABETIC COMPLICATIONS: Microvascular complications:   Denies: CKD, neuropathy , DR  Last eye exam: Completed 09/2022  Macrovascular complications:   Denies: CAD, PVD, CVA   PAST HISTORY: Past Medical History:  Past Medical History:  Diagnosis Date   Diabetes mellitus without complication (HCC)    Thyroid  disease    Past Surgical History:  Past Surgical History:  Procedure Laterality Date   HEMATOMA EVACUATION     on L leg   NM THYROID  STIM SUPRESS     IN ACTIVE    Social History:  reports  that he has never smoked. He has never been exposed to tobacco smoke. He has never used smokeless tobacco. He reports that he does not drink alcohol and does not use drugs. Family History:  Family History  Problem Relation Age of Onset   Diabetes Mother    Diabetes Maternal Grandmother    Colon cancer Neg Hx    Colon polyps Neg Hx    Crohn's disease Neg Hx    Esophageal cancer Neg Hx    Rectal cancer Neg Hx    Stomach cancer Neg Hx    Ulcerative colitis Neg Hx      HOME MEDICATIONS: Allergies as of 07/26/2024   No Known Allergies      Medication List        Accurate as of July 26, 2024  1:09 PM. If you have any  questions, ask your nurse or doctor.          acetaminophen  500 MG tablet Commonly known as: TYLENOL  Take 1 tablet (500 mg total) by mouth every 6 (six) hours as needed.   Basaglar  KwikPen 100 UNIT/ML Inject 28 Units into the skin daily.   FreeStyle [employer] 3 Sensor Misc See admin instructions.   FreeStyle Libre 2 Plus Sensor Misc 1 applicator by Does not apply route continuous. Apply to skin and change every 15 days as directed by manufacturer   ibuprofen  600 MG tablet Commonly known as: ADVIL  Take 1 tablet (600 mg total) by mouth every 6 (six) hours as needed.   insulin  aspart 100 UNIT/ML injection Commonly known as: novoLOG  Max daily 50 units   NovoLOG  FlexPen 100 UNIT/ML FlexPen Generic drug: insulin  aspart SMARTSIG:1 Unit(s) SUB-Q   levothyroxine  175 MCG tablet Commonly known as: SYNTHROID  Take 1 tablet (175 mcg total) by mouth daily.   Omnipod 5 Libre2 Plus G6 Pods Misc 1 each by Does not apply route every 3 (three) days.   potassium chloride SA 20 MEQ tablet Commonly known as: KLOR-CON M Take 2 tablets (40 mEq total) by mouth daily for 4 days.   rosuvastatin  10 MG tablet Commonly known as: Crestor  Take 1 tablet (10 mg total) by mouth daily.   Vitamin D3 1.25 MG (50000 UT) Caps Take 1 capsule by mouth once a week.         ALLERGIES: No Known Allergies   REVIEW OF SYSTEMS: A comprehensive ROS was conducted with the patient and is negative except as per HPI     OBJECTIVE:   VITAL SIGNS: BP 130/70 (BP Location: Left Arm, Patient Position: Sitting, Cuff Size: Normal)   Pulse (!) 113   Ht 6' 1 (1.854 m)   Wt 231 lb 6.4 oz (105 kg)   SpO2 98%   BMI 30.53 kg/m    PHYSICAL EXAM:  General: Pt appears well and is in NAD  Lungs: Clear with good BS bilat   Heart: RRR   Abdomen:  soft, nontender  Extremities:  Lower extremities - No pretibial edema.   Neuro: MS is good with appropriate affect, pt is alert and Ox3    DM foot exam:  07/26/2024  The skin of the feet is intact without sores or ulcerations. The pedal pulses are 2+ on right and 2+ on left. The sensation is intact to a screening 5.07, 10 gram monofilament bilaterally    DATA REVIEWED:  Lab Results  Component Value Date   HGBA1C 7.7 (A) 07/26/2024   HGBA1C 7.4 (A) 01/21/2024   HGBA1C 7.4 (A) 09/09/2023  Latest Reference Range & Units 07/26/24 13:45  TSH 0.40 - 4.50 mIU/L 0.79    Latest Reference Range & Units 07/26/24 13:45  Microalb, Ur mg/dL 0.3  MICROALB/CREAT RATIO <30 mg/g creat 1  Creatinine, Urine 20 - 320 mg/dL 753     Latest Reference Range & Units 07/16/24 10:18  Sodium 135 - 145 mmol/L 137  Potassium 3.5 - 5.1 mmol/L 2.9 (L)  Chloride 98 - 111 mmol/L 102  CO2 22 - 32 mmol/L 23  Glucose 70 - 99 mg/dL 840 (H)  BUN 6 - 20 mg/dL 19  Creatinine 9.38 - 8.75 mg/dL 8.91  Calcium  8.9 - 10.3 mg/dL 9.0  Anion gap 5 - 15  12  GFR, Estimated >60 mL/min >60   07/12/2024  LDL 139 Triglycerides 103 HDL 50  ASSESSMENT / PLAN / RECOMMENDATIONS:   1) Type 1 Diabetes Mellitus, Sub-optimally controlled, Without complications - Most recent A1c of 7.7 %. Goal A1c < 7.0 %.     - A1c remains above goal -He was prescribed/trained on the OmniPod in June, 2025 but this was cost prohibitive with a co-pay of $1200 -Patient continues with glycemic excursions, I will decrease Basaglar  due to hypoglycemia - No other changes - MA/CR ratio normal  MEDICATIONS: Decrease Basaglar  24 units daily Continue NovoLog  I:C ratio 1:10 TIDQAC CF : Novolog  ( BG -130/35)   EDUCATION / INSTRUCTIONS: BG monitoring instructions: Patient is instructed to check his blood sugars 3 times a day. Call [facility] if: BG persistently < 70  I reviewed the Rule of 15 for the treatment of hypoglycemia in detail with the patient. Literature supplied.   2) Diabetic complications:  Eye: Does not have known diabetic retinopathy.  Neuro/ Feet: Does not  have known diabetic peripheral neuropathy. Renal: Patient does not have known baseline CKD. He is not on an ACEI/ARB at present.  3) Dyslipidemia :    -LDL remains above goal -Will increase rosuvastatin  as below  Medication Increase rosuvastatin  20 mg daily  4) Postablative hypothyroidism:   -Patient is clinically euthyroid - TSH is normal, no change  Medication Continue levothyroxine  175 mcg daily   Follow-up in 6 months  Signed electronically by: Stefano Zavala Butts, [name]  [facility]  [facility] [address] [place] [place], [place] [address] Phone: [phone] FAX: [phone]   CC: Benjamine Aland, [name] [address] [place], [address] [place] [address] Phone: [phone]  Fax: [phone]    Return to Endocrinology clinic as below: Future Appointments  Date Time Provider Department Center  07/26/2024  1:20 PM [name], Donald Redgie, [name] [facility] None

## 2024-07-26 NOTE — Patient Instructions (Addendum)
 Decrease Basaglar  24 units daily  Novolog  insulin  to carb ratio 1:10 with each meal  Novolog  correctional insulin : ADD extra units on insulin  to your meal-time Novolog  dose if your blood sugars are higher than 165. Use the scale below to help guide you:   Blood sugar before meal Number of units to inject  Less than 165 0 unit  166 -  200 1 units  201 -  235 2 units  236 -  270 3 units  271 -  305 4 units  306 -  340 5 units  341 -  375 6 units  376 -  410 7 units      HOW TO TREAT LOW BLOOD SUGARS (Blood sugar LESS THAN 70 MG/DL) Please follow the RULE OF 15 for the treatment of hypoglycemia treatment (when your (blood sugars are less than 70 mg/dL)   STEP 1: Take 15 grams of carbohydrates when your blood sugar is low, which includes:  3-4 GLUCOSE TABS  OR 3-4 OZ OF JUICE OR REGULAR SODA OR ONE TUBE OF GLUCOSE GEL    STEP 2: RECHECK blood sugar in 15 MINUTES STEP 3: If your blood sugar is still low at the 15 minute recheck --> then, go back to STEP 1 and treat AGAIN with another 15 grams of carbohydrates.

## 2024-07-27 ENCOUNTER — Ambulatory Visit: Payer: Self-pay | Admitting: Internal Medicine

## 2024-07-27 ENCOUNTER — Encounter: Payer: Self-pay | Admitting: Internal Medicine

## 2024-07-27 LAB — TSH: TSH: 0.79 m[IU]/L (ref 0.40–4.50)

## 2024-07-27 LAB — MICROALBUMIN / CREATININE URINE RATIO
Creatinine, Urine: 246 mg/dL (ref 20–320)
Microalb Creat Ratio: 1 mg/g{creat} (ref <30)
Microalb, Ur: 0.3 mg/dL

## 2024-07-27 MED ORDER — LEVOTHYROXINE SODIUM 175 MCG PO TABS: 175.0000 ug | ORAL_TABLET | Freq: Every day | ORAL | 3 refills | Status: AC

## 2025-01-24 ENCOUNTER — Ambulatory Visit: Admitting: Internal Medicine
